# Patient Record
Sex: Male | Born: 1990 | Hispanic: No | Marital: Single | Smoking: Former smoker
Health system: Southern US, Community
[De-identification: ages and names within clinical notes are randomized; demographics above are authoritative.]

## PROBLEM LIST (undated history)

## (undated) DIAGNOSIS — K219 Gastro-esophageal reflux disease without esophagitis: Secondary | ICD-10-CM

## (undated) DIAGNOSIS — IMO0001 Reserved for inherently not codable concepts without codable children: Secondary | ICD-10-CM

---
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
## 2011-04-20 ENCOUNTER — Ambulatory Visit: Payer: Self-pay | Admitting: Family Medicine

## 2011-04-20 VITALS — BP 133/80 | HR 56 | Temp 97.6°F | Resp 16 | Ht 68.0 in | Wt 189.2 lb

## 2011-04-20 DIAGNOSIS — K219 Gastro-esophageal reflux disease without esophagitis: Secondary | ICD-10-CM

## 2011-04-20 DIAGNOSIS — R110 Nausea: Secondary | ICD-10-CM

## 2011-04-20 MED ORDER — PANTOPRAZOLE SODIUM 40 MG PO TBEC: DELAYED_RELEASE_TABLET | ORAL | Status: DC

## 2011-04-20 MED ORDER — METOCLOPRAMIDE HCL 5 MG PO TABS: ORAL_TABLET | ORAL | Status: DC

## 2011-04-20 NOTE — Progress Notes (Signed)
  Subjective:    Patient ID: Alexander Adams, male    DOB: 09/13/1990, 21 y.o.   MRN: [id number]  HPI Patient has been having nausea all week. He vomited Monday night. He has not been able to eat anything because of the nausea. He has taken OTC Prilosec without good relief. He has a long history of GERD but he has never had evaluation of it. He does not have any insurance. He has not been to a specialist about this.   Review of Systems     Objective:   Physical Exam [place] male, normal build. TMs normal throat. Throat clear. Neck supple without nodes. Chest clear to auscultation. Heart regular without with grade 1/6 systolic ejection murmur left upper sternal border. Abdomen soft without masses or tenderness except for maybe minimal epigastric tenderness. No CVA tenderness. Normal bowel sounds. Skin warm and dry.      Assessment & Plan:  GERD.  Nausea  Decided not to do additional labs yet. Will treat him for his symptoms, and see how he does. If he keeps having problems will need to come back at which time he would have to have labs including an H. pylori.

## 2011-04-20 NOTE — Patient Instructions (Addendum)
Take the medicine as ordered. Return sooner if problems.   Gastroesophageal Reflux Disease, Adult Gastroesophageal reflux disease (GERD) happens when acid from your stomach flows up into the esophagus. When acid comes in contact with the esophagus, the acid causes soreness (inflammation) in the esophagus. Over time, GERD may create small holes (ulcers) in the lining of the esophagus. CAUSES   Increased body weight. This puts pressure on the stomach, making acid rise from the stomach into the esophagus.   Smoking. This increases acid production in the stomach.   Drinking alcohol. This causes decreased pressure in the lower esophageal sphincter (valve or ring of muscle between the esophagus and stomach), allowing acid from the stomach into the esophagus.   Late evening meals and a full stomach. This increases pressure and acid production in the stomach.   A malformed lower esophageal sphincter.  Sometimes, no cause is found. SYMPTOMS   Burning pain in the lower part of the mid-chest behind the breastbone and in the mid-stomach area. This may occur twice a week or more often.   Trouble swallowing.   Sore throat.   Dry cough.   Asthma-like symptoms including chest tightness, shortness of breath, or wheezing.  DIAGNOSIS  Your caregiver may be able to diagnose GERD based on your symptoms. In some cases, X-rays and other tests may be done to check for complications or to check the condition of your stomach and esophagus. TREATMENT  Your caregiver may recommend over-the-counter or prescription medicines to help decrease acid production. Ask your caregiver before starting or adding any new medicines.  HOME CARE INSTRUCTIONS   Change the factors that you can control. Ask your caregiver for guidance concerning weight loss, quitting smoking, and alcohol consumption.   Avoid foods and drinks that make your symptoms worse, such as:   Caffeine or alcoholic drinks.   Chocolate.   Peppermint  or mint flavorings.   Garlic and onions.   Spicy foods.   Citrus fruits, such as oranges, lemons, or limes.   Tomato-based foods such as sauce, chili, salsa, and pizza.   Fried and fatty foods.   Avoid lying down for the 3 hours prior to your bedtime or prior to taking a nap.   Eat small, frequent meals instead of large meals.   Wear loose-fitting clothing. Do not wear anything tight around your waist that causes pressure on your stomach.   Raise the head of your bed 6 to 8 inches with wood blocks to help you sleep. Extra pillows will not help.   Only take over-the-counter or prescription medicines for pain, discomfort, or fever as directed by your caregiver.   Do not take aspirin, ibuprofen, or other nonsteroidal anti-inflammatory drugs (NSAIDs).  SEEK IMMEDIATE MEDICAL CARE IF:   You have pain in your arms, neck, jaw, teeth, or back.   Your pain increases or changes in intensity or duration.   You develop nausea, vomiting, or sweating (diaphoresis).   You develop shortness of breath, or you faint.   Your vomit is green, yellow, black, or looks like coffee grounds or blood.   Your stool is red, bloody, or black.  These symptoms could be signs of other problems, such as heart disease, gastric bleeding, or esophageal bleeding. MAKE SURE YOU:   Understand these instructions.   Will watch your condition.   Will get help right away if you are not doing well or get worse.  Document Released: 11/23/2004 Document Revised: 10/26/2010 Document Reviewed: 09/02/2010 [facility] Patient Information 2012 [employer] 

## 2011-11-11 ENCOUNTER — Emergency Department (HOSPITAL_BASED_OUTPATIENT_CLINIC_OR_DEPARTMENT_OTHER): Payer: Self-pay

## 2011-11-11 ENCOUNTER — Emergency Department (HOSPITAL_BASED_OUTPATIENT_CLINIC_OR_DEPARTMENT_OTHER)
Admission: EM | Admit: 2011-11-11 | Discharge: 2011-11-11 | Disposition: A | Discharge status: Home / Self Care | Payer: Self-pay | Attending: Emergency Medicine | Admitting: Emergency Medicine

## 2011-11-11 ENCOUNTER — Encounter (HOSPITAL_BASED_OUTPATIENT_CLINIC_OR_DEPARTMENT_OTHER): Payer: Self-pay | Admitting: *Deleted

## 2011-11-11 DIAGNOSIS — M549 Dorsalgia, unspecified: Secondary | ICD-10-CM | POA: Insufficient documentation

## 2011-11-11 DIAGNOSIS — Z87891 Personal history of nicotine dependence: Secondary | ICD-10-CM | POA: Insufficient documentation

## 2011-11-11 DIAGNOSIS — K219 Gastro-esophageal reflux disease without esophagitis: Secondary | ICD-10-CM | POA: Insufficient documentation

## 2011-11-11 HISTORY — DX: Reserved for inherently not codable concepts without codable children: IMO0001

## 2011-11-11 HISTORY — DX: Gastro-esophageal reflux disease without esophagitis: K21.9

## 2011-11-11 MED ORDER — IBUPROFEN 600 MG PO TABS: 600.0000 mg | ORAL_TABLET | Freq: Four times a day (QID) | ORAL | Status: AC | PRN

## 2011-11-11 NOTE — ED Provider Notes (Addendum)
History     CSN: [id number]  Arrival date & time 11/11/11  1240   First MD Initiated Contact with Patient 11/11/11 1321      Chief Complaint  Patient presents with  . Back Pain    (Consider location/radiation/quality/duration/timing/severity/associated sxs/prior treatment) Patient is a 21 y.o. male presenting with back pain. The history is provided by the patient.  Back Pain  The current episode started 2 days ago. The problem has not changed since onset.The pain is associated with falling (About 2 weeks ago.). The pain is present in the lumbar spine. The pain does not radiate. The pain is at a severity of 8/10. The symptoms are aggravated by certain positions. Pertinent negatives include no chest pain, no fever, no numbness, no headaches, no abdominal pain, no bowel incontinence, no perianal numbness, no dysuria, no leg pain, no paresthesias, no paresis, no tingling and no weakness.    Past Medical History  Diagnosis Date  . Reflux     History reviewed. No pertinent past surgical history.  History reviewed. No pertinent family history.  History  Substance Use Topics  . Smoking status: Former Smoker    Quit date: 08/17/2009  . Smokeless tobacco: Not on file  . Alcohol Use: No      Review of Systems  Constitutional: Negative for fever.  HENT: Negative for neck pain.   Eyes: Negative for visual disturbance.  Respiratory: Negative for shortness of breath.   Cardiovascular: Negative for chest pain.  Gastrointestinal: Negative for abdominal pain and bowel incontinence.  Genitourinary: Negative for dysuria.  Musculoskeletal: Positive for back pain.  Skin: Negative for rash.  Neurological: Negative for tingling, weakness, numbness, headaches and paresthesias.  Hematological: Does not bruise/bleed easily.    Allergies  Review of patient's allergies indicates no known allergies.  Home Medications   Current Outpatient Rx  Name Route Sig Dispense Refill  . RANITIDINE  HCL 150 MG PO TABS Oral Take 300 mg by mouth 2 (two) times daily.    Marland Kitchen METOCLOPRAMIDE HCL 5 MG PO TABS  Take 1 tablet 4 times daily as needed for nausea. 40 tablet 0  . OMEPRAZOLE 20 MG PO CPDR Oral Take 20 mg by mouth daily.    Marland Kitchen PANTOPRAZOLE SODIUM 40 MG PO TBEC  Take 1 twice daily for 5 days, then once daily for reflux. 30 tablet 3    BP 137/70  Pulse 50  Temp 98 F (36.7 C) (Oral)  Resp 18  Ht 5\' 10"  (1.778 m)  Wt 185 lb (83.915 kg)  BMI 26.54 kg/m2  SpO2 99%  Physical Exam  Nursing note and vitals reviewed. Constitutional: He is oriented to person, place, and time. He appears well-developed and well-nourished. No distress.  HENT:  Head: Normocephalic and atraumatic.  Mouth/Throat: Oropharynx is clear and moist.  Eyes: Conjunctivae normal and EOM are normal. Pupils are equal, round, and reactive to light.  Neck: Normal range of motion. Neck supple.  Cardiovascular: Normal rate, regular rhythm and normal heart sounds.   No murmur heard. Pulmonary/Chest: Effort normal and breath sounds normal.  Abdominal: Soft. Bowel sounds are normal. There is no tenderness.  Musculoskeletal: Normal range of motion. He exhibits tenderness.       Mild tenderness to the lumbar area predominantly on the right side no muscle spasm. No midline point tenderness.  Neurological: He is alert and oriented to person, place, and time. No cranial nerve deficit. He exhibits normal muscle tone. Coordination normal.  Skin: Skin is warm. No  rash noted.    ED Course  Procedures (including critical care time)  Labs Reviewed - No data to display Dg Lumbar Spine Complete  11/11/2011  *RADIOLOGY REPORT*  Clinical Data: Fall.  Back injury. Right-sided back pain  LUMBAR SPINE - COMPLETE 4+ VIEW  Comparison: None.  Findings: Linear transverse lucency in the right sacral ala probably represents incidental segmental transverse talar process or bowel gas, and is less likely to represent a transverse sacral fracture at  the S1-2 level particularly given the lack of corroborative findings on the lateral projection.  Lumbar spine alignment appears normal.  No lumbar spine compression fracture observed.  IMPRESSION:  1.  Linear lucency overlying the right sacral ala extends transversely and is probably incidental.  Correlate with any point tenderness over the right side of the sacrum; if such exists, consider CT or MRI to assess for sacral fracture.   Otherwise, no significant abnormality identified.   Original Report Authenticated By: Dellia Cloud, M.D.      1. Back pain       MDM  Patient with an injury to the back now on getting more information about 2 weeks ago when he had a fall but nothing bothered him then. Pack started bothering past few days does do a lot of working out. Most likely just musculoskeletal back pain lumbar series ordered to rule out any acute injury.   Lumbar plain films raise concern for a right-sided sacral fracture patient does have tenderness to palpation over that area we'll go ahead and do a CT of the pelvis to this out. Patient will be turned over to the evening emergency physician. For disposition.        Shelda Jakes, MD 11/11/11 1503  Shelda Jakes, MD 11/11/11 [phone]

## 2011-11-11 NOTE — ED Notes (Signed)
Pt states he works out a lot and thinks that he may have injured his lower back "over time".

## 2012-08-30 ENCOUNTER — Emergency Department (HOSPITAL_COMMUNITY)
Admission: EM | Admit: 2012-08-30 | Discharge: 2012-08-30 | Disposition: A | Discharge status: Home / Self Care | Payer: No Typology Code available for payment source | Attending: Emergency Medicine | Admitting: Emergency Medicine

## 2012-08-30 ENCOUNTER — Encounter (HOSPITAL_COMMUNITY): Payer: Self-pay

## 2012-08-30 DIAGNOSIS — Z87891 Personal history of nicotine dependence: Secondary | ICD-10-CM | POA: Insufficient documentation

## 2012-08-30 DIAGNOSIS — IMO0002 Reserved for concepts with insufficient information to code with codable children: Secondary | ICD-10-CM | POA: Insufficient documentation

## 2012-08-30 DIAGNOSIS — Z79899 Other long term (current) drug therapy: Secondary | ICD-10-CM | POA: Insufficient documentation

## 2012-08-30 DIAGNOSIS — K219 Gastro-esophageal reflux disease without esophagitis: Secondary | ICD-10-CM | POA: Insufficient documentation

## 2012-08-30 DIAGNOSIS — Y9389 Activity, other specified: Secondary | ICD-10-CM | POA: Insufficient documentation

## 2012-08-30 DIAGNOSIS — Y92410 Unspecified street and highway as the place of occurrence of the external cause: Secondary | ICD-10-CM | POA: Insufficient documentation

## 2012-08-30 DIAGNOSIS — T148XXA Other injury of unspecified body region, initial encounter: Secondary | ICD-10-CM

## 2012-08-30 MED ORDER — CYCLOBENZAPRINE HCL 10 MG PO TABS: 10.0000 mg | ORAL_TABLET | Freq: Two times a day (BID) | ORAL | Status: DC | PRN

## 2012-08-30 MED ORDER — NAPROXEN 500 MG PO TABS: 500.0000 mg | ORAL_TABLET | Freq: Two times a day (BID) | ORAL | Status: DC
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
## 2012-09-01 NOTE — ED Provider Notes (Signed)
Medical screening examination/treatment/procedure(s) were performed by non-physician practitioner and as supervising physician I was immediately available for consultation/collaboration.  Olivia Mackie, MD 09/01/12 2112

## 2013-01-12 ENCOUNTER — Emergency Department (HOSPITAL_BASED_OUTPATIENT_CLINIC_OR_DEPARTMENT_OTHER)
Admission: EM | Admit: 2013-01-12 | Discharge: 2013-01-12 | Disposition: A | Discharge status: Home / Self Care | Payer: Self-pay | Attending: Emergency Medicine | Admitting: Emergency Medicine

## 2013-01-12 ENCOUNTER — Encounter (HOSPITAL_BASED_OUTPATIENT_CLINIC_OR_DEPARTMENT_OTHER): Payer: Self-pay | Admitting: Emergency Medicine

## 2013-01-12 ENCOUNTER — Emergency Department (HOSPITAL_BASED_OUTPATIENT_CLINIC_OR_DEPARTMENT_OTHER): Payer: Self-pay

## 2013-01-12 DIAGNOSIS — W2209XA Striking against other stationary object, initial encounter: Secondary | ICD-10-CM | POA: Insufficient documentation

## 2013-01-12 DIAGNOSIS — Z87891 Personal history of nicotine dependence: Secondary | ICD-10-CM | POA: Insufficient documentation

## 2013-01-12 DIAGNOSIS — Y9389 Activity, other specified: Secondary | ICD-10-CM | POA: Insufficient documentation

## 2013-01-12 DIAGNOSIS — S8990XA Unspecified injury of unspecified lower leg, initial encounter: Secondary | ICD-10-CM | POA: Insufficient documentation

## 2013-01-12 DIAGNOSIS — K219 Gastro-esophageal reflux disease without esophagitis: Secondary | ICD-10-CM | POA: Insufficient documentation

## 2013-01-12 DIAGNOSIS — Z79899 Other long term (current) drug therapy: Secondary | ICD-10-CM | POA: Insufficient documentation

## 2013-01-12 DIAGNOSIS — Y9239 Other specified sports and athletic area as the place of occurrence of the external cause: Secondary | ICD-10-CM | POA: Insufficient documentation

## 2013-01-12 DIAGNOSIS — M79673 Pain in unspecified foot: Secondary | ICD-10-CM | POA: Diagnosis present

## 2013-01-12 DIAGNOSIS — M79671 Pain in right foot: Secondary | ICD-10-CM

## 2013-01-12 HISTORY — DX: Gastro-esophageal reflux disease without esophagitis: K21.9

## 2013-01-12 MED ORDER — OXYCODONE-ACETAMINOPHEN 5-325 MG PO TABS: 1.0000 | ORAL_TABLET | ORAL | Status: DC | PRN

## 2013-01-12 NOTE — ED Provider Notes (Signed)
CSN: [id number]     Arrival date & time 01/12/13  1054 History   First MD Initiated Contact with Patient 01/12/13 1117     Chief Complaint  Patient presents with  . Foot Pain   (Consider location/radiation/quality/duration/timing/severity/associated sxs/prior Treatment) Patient is a 22 y.o. male presenting with lower extremity pain. The history is provided by the patient.  Foot Pain This is a new problem. The current episode started more than 2 days ago. The problem occurs constantly. The problem has not changed since onset.Pertinent negatives include no chest pain, no abdominal pain, no headaches and no shortness of breath. Exacerbated by: walking for long periods of time. Nothing relieves the symptoms. He has tried nothing for the symptoms. The treatment provided moderate relief.    Past Medical History  Diagnosis Date  . Reflux   . GERD (gastroesophageal reflux disease)    History reviewed. No pertinent past surgical history. No family history on file. History  Substance Use Topics  . Smoking status: Former Smoker    Quit date: 08/17/2009  . Smokeless tobacco: Not on file  . Alcohol Use: No    Review of Systems  Constitutional: Negative for fever.  HENT: Negative for drooling and rhinorrhea.   Eyes: Negative for pain.  Respiratory: Negative for cough and shortness of breath.   Cardiovascular: Negative for chest pain and leg swelling.  Gastrointestinal: Negative for nausea, vomiting, abdominal pain and diarrhea.  Genitourinary: Negative for dysuria and hematuria.  Musculoskeletal: Negative for gait problem and neck pain.  Skin: Negative for color change.  Neurological: Negative for numbness and headaches.  Hematological: Negative for adenopathy.  Psychiatric/Behavioral: Negative for behavioral problems.  All other systems reviewed and are negative.    Allergies  Review of patient's allergies indicates no known allergies.  Home Medications   Current Outpatient Rx   Name  Route  Sig  Dispense  Refill  . esomeprazole (NEXIUM) 20 MG capsule   Oral   Take 20 mg by mouth daily at 12 noon.          BP 150/84  Pulse 58  Temp(Src) 97.9 F (36.6 C) (Oral)  Wt 195 lb (88.451 kg)  SpO2 100% Physical Exam  Nursing note and vitals reviewed. Constitutional: He is oriented to person, place, and time. He appears well-developed and well-nourished.  HENT:  Head: Normocephalic and atraumatic.  Right Ear: External ear normal.  Left Ear: External ear normal.  Nose: Nose normal.  Mouth/Throat: Oropharynx is clear and moist. No oropharyngeal exudate.  Eyes: Conjunctivae and EOM are normal. Pupils are equal, round, and reactive to light.  Neck: Normal range of motion. Neck supple.  Cardiovascular: Normal rate, regular rhythm, normal heart sounds and intact distal pulses.  Exam reveals no gallop and no friction rub.   No murmur heard. Pulmonary/Chest: Effort normal and breath sounds normal. No respiratory distress. He has no wheezes.  Abdominal: Soft. Bowel sounds are normal. He exhibits no distension. There is no tenderness. There is no rebound and no guarding.  Musculoskeletal: Normal range of motion. He exhibits tenderness. He exhibits no edema.  Normal range of motion of right lower extremity. 2+ distal pulses. Sensation is intact diffusely.  The patient has a small bony prominence at the base of the fifth metatarsal on the right foot. It is only mildly tender to palpation.  Neurological: He is alert and oriented to person, place, and time.  Skin: Skin is warm and dry.  Psychiatric: He has a normal mood and affect.  His behavior is normal.    ED Course  Procedures (including critical care time) Labs Review Labs Reviewed - No data to display Imaging Review Dg Foot Complete Right  01/12/2013   CLINICAL DATA:  Lateral pain after trauma.  EXAM: RIGHT FOOT COMPLETE - 3+ VIEW  COMPARISON:  None.  FINDINGS: No acute fracture or dislocation.  IMPRESSION: No  acute osseous abnormality.   Electronically Signed   By: Jeronimo Greaves M.D.   On: 01/12/2013 11:15    EKG Interpretation   None       MDM   1. Foot pain, right    11:30 AM 22 y.o. male who presents with right lateral foot pain after kicking a bag in the gym while working out several days ago. The patient is ambulatory without difficulty. He notes only mild tenderness to palpation on exam. He has an obvious mild bony deformity on the right lateral foot on my exam. His plain film is completely normal and I discussed the films with the radiologist who sees no abnormality. It is possible that this is an anatomic variant that has always been there and is just now being noticed. Another possibility is that this is a small hematoma or soft tissue swelling that just feels hard to palpation. Will recommend hard soled shoes and follow up with ortho if his pain persists.  11:31 AM:  I have discussed the diagnosis/risks/treatment options with the patient and believe the pt to be eligible for discharge home to follow-up with ortho if his pain persists. We also discussed returning to the ED immediately if new or worsening sx occur. We discussed the sx which are most concerning (e.g., worsening pain) that necessitate immediate return. Any new prescriptions provided to the patient are listed below.  Discharge Medication List as of 01/12/2013 11:37 AM    START taking these medications   Details  oxyCODONE-acetaminophen (PERCOCET) 5-325 MG per tablet Take 1 tablet by mouth every 4 (four) hours as needed., Starting 01/12/2013, Until Discontinued, Print           Junius Argyle, MD 01/12/13 [phone]

## 2013-01-12 NOTE — ED Notes (Signed)
Patient here with right lateral foot pain after kicking bench at the gym, tender to touch with swelling noted

## 2013-01-29 ENCOUNTER — Ambulatory Visit: Payer: Self-pay

## 2014-02-16 ENCOUNTER — Emergency Department (HOSPITAL_BASED_OUTPATIENT_CLINIC_OR_DEPARTMENT_OTHER)
Admission: EM | Admit: 2014-02-16 | Discharge: 2014-02-16 | Disposition: A | Discharge status: Home / Self Care | Payer: No Typology Code available for payment source | Attending: Emergency Medicine | Admitting: Emergency Medicine

## 2014-02-16 ENCOUNTER — Encounter (HOSPITAL_BASED_OUTPATIENT_CLINIC_OR_DEPARTMENT_OTHER): Payer: Self-pay | Admitting: *Deleted

## 2014-02-16 DIAGNOSIS — K219 Gastro-esophageal reflux disease without esophagitis: Secondary | ICD-10-CM | POA: Insufficient documentation

## 2014-02-16 DIAGNOSIS — Z79899 Other long term (current) drug therapy: Secondary | ICD-10-CM | POA: Insufficient documentation

## 2014-02-16 DIAGNOSIS — Z87891 Personal history of nicotine dependence: Secondary | ICD-10-CM | POA: Insufficient documentation

## 2014-02-16 DIAGNOSIS — H6123 Impacted cerumen, bilateral: Secondary | ICD-10-CM | POA: Insufficient documentation

## 2014-02-16 DIAGNOSIS — H6091 Unspecified otitis externa, right ear: Secondary | ICD-10-CM | POA: Insufficient documentation

## 2014-02-16 MED ORDER — ANTIPYRINE-BENZOCAINE 5.4-1.4 % OT SOLN: 3.0000 [drp] | Freq: Three times a day (TID) | OTIC | Status: DC | PRN

## 2014-02-16 MED ORDER — NEOMYCIN-POLYMYXIN-HC 3.5-10000-1 OT SUSP: 4.0000 [drp] | Freq: Four times a day (QID) | OTIC | Status: DC

## 2014-02-16 NOTE — ED Provider Notes (Signed)
CSN: [id number]     Arrival date & time 02/16/14  1156 History   First MD Initiated Contact with Patient 02/16/14 1211     Chief Complaint  Patient presents with  . Otalgia     (Consider location/radiation/quality/duration/timing/severity/associated sxs/prior Treatment) HPI Comments: 23 year old male presenting with right ear pain 1 day. Patient reports last night he used an over-the-counter earwax remover drop, and believes he got the wax stuck. He developed pain after putting the drops in. States he's had decreased hearing in the right ear. Denies fever, chills, ear drainage.  Patient is a 23 y.o. male presenting with ear pain. The history is provided by the patient.  Otalgia Associated symptoms: hearing loss   Associated symptoms: no fever and no vomiting     Past Medical History  Diagnosis Date  . Reflux   . GERD (gastroesophageal reflux disease)    History reviewed. No pertinent past surgical history. No family history on file. History  Substance Use Topics  . Smoking status: Former Smoker    Quit date: 08/17/2009  . Smokeless tobacco: Not on file  . Alcohol Use: No    Review of Systems  Constitutional: Negative for fever.  HENT: Positive for ear pain and hearing loss.   Respiratory: Negative.   Cardiovascular: Negative.   Gastrointestinal: Negative for vomiting.  Musculoskeletal: Negative.   Skin: Negative.   Neurological: Negative.       Allergies  Review of patient's allergies indicates no known allergies.  Home Medications   Prior to Admission medications   Medication Sig Start Date End Date Taking? Authorizing Provider  Omeprazole (PRILOSEC PO) Take by mouth.   Yes Historical Provider, MD  antipyrine-benzocaine Lyla Son(AURALGAN) otic solution Place 3-4 drops into the right ear every 8 (eight) hours as needed for ear pain. 02/16/14   Ridwan Bondy M Perkins Molina, PA-C  esomeprazole (NEXIUM) 20 MG capsule Take 20 mg by mouth daily at 12 noon.    Historical Provider, MD   neomycin-polymyxin-hydrocortisone (CORTISPORIN) 3.5-10000-1 otic suspension Place 4 drops into the right ear 4 (four) times daily. X 7 days 02/16/14   Kathrynn Speedobyn M Tyvon Eggenberger, PA-C  oxyCODONE-acetaminophen (PERCOCET) 5-325 MG per tablet Take 1 tablet by mouth every 4 (four) hours as needed. 01/12/13   Purvis SheffieldForrest Harrison, MD   BP 157/74 mmHg  Pulse 64  Temp(Src) 98.3 F (36.8 C) (Oral)  Resp 16  Ht 5\' 10"  (1.778 m)  Wt 190 lb (86.183 kg)  BMI 27.26 kg/m2  SpO2 100% Physical Exam  Constitutional: He is oriented to person, place, and time. He appears well-developed and well-nourished. No distress.  HENT:  Head: Normocephalic and atraumatic.  BL TM obstructed by cerumen.  Eyes: Conjunctivae and EOM are normal.  Neck: Normal range of motion. Neck supple.  Cardiovascular: Normal rate, regular rhythm and normal heart sounds.   Pulmonary/Chest: Effort normal and breath sounds normal.  Musculoskeletal: Normal range of motion. He exhibits no edema.  Neurological: He is alert and oriented to person, place, and time.  Skin: Skin is warm and dry.  Psychiatric: He has a normal mood and affect. His behavior is normal.  Nursing note and vitals reviewed.   ED Course  Irrigation Date/Time: 02/16/2014 12:55 PM Performed by: Kathrynn SpeedHESS, Breylon Sherrow M Authorized by: Kathrynn SpeedHESS, Clark Clowdus M Consent: Verbal consent obtained. Consent given by: patient Comments: Cerumen removal with irrigation and curette   (including critical care time) Labs Review Labs Reviewed - No data to display  Imaging Review No results found.   EKG Interpretation None  MDM   Final diagnoses:  Right otitis externa  Cerumen impaction, bilateral   Patient in no apparent distress. Vital signs stable. Bilateral cerumen impaction. Irrigation done to both ears, small amount of wax removed. He still has wax present in both ears after irrigation, further wax removal with curette on right. He is refusing flax removal with curette on left. Underlying  otitis externa on right. Treat with Cortisporin ear drops. Stable for discharge. Return precautions given. Patient states understanding of treatment care plan and is agreeable.  Kathrynn SpeedRobyn M Bonnee Zertuche, PA-C 02/16/14 1257  Geoffery Lyonsouglas Delo, MD 02/16/14 [phone]

## 2014-02-16 NOTE — Discharge Instructions (Signed)
Use ear drops as prescribed.  Cerumen Impaction A cerumen impaction is when the wax in your ear forms a plug. This plug usually causes reduced hearing. Sometimes it also causes an earache or dizziness. Removing a cerumen impaction can be difficult and painful. The wax sticks to the ear canal. The canal is sensitive and bleeds easily. If you try to remove a heavy wax buildup with a cotton tipped swab, you may push it in further. Irrigation with water, suction, and small ear curettes may be used to clear out the wax. If the impaction is fixed to the skin in the ear canal, ear drops may be needed for a few days to loosen the wax. People who build up a lot of wax frequently can use ear wax removal products available in your local drugstore. SEEK MEDICAL CARE IF:  You develop an earache, increased hearing loss, or marked dizziness. Document Released: 03/23/2004 Document Revised: 05/08/2011 Document Reviewed: 05/13/2009 [facility] Patient Information 2015 [place], [place]. This information is not intended to replace advice given to you by your health care provider. Make sure you discuss any questions you have with your health care provider.  Otitis Externa Otitis externa is a bacterial or fungal infection of the outer ear canal. This is the area from the eardrum to the outside of the ear. Otitis externa is sometimes called "swimmer's ear." CAUSES  Possible causes of infection include:  Swimming in dirty water.  Moisture remaining in the ear after swimming or bathing.  Mild injury (trauma) to the ear.  Objects stuck in the ear (foreign body).  Cuts or scrapes (abrasions) on the outside of the ear. SIGNS AND SYMPTOMS  The first symptom of infection is often itching in the ear canal. Later signs and symptoms may include swelling and redness of the ear canal, ear pain, and yellowish-white fluid (pus) coming from the ear. The ear pain may be worse when pulling on the earlobe. DIAGNOSIS  Your health care  provider will perform a physical exam. A sample of fluid may be taken from the ear and examined for bacteria or fungi. TREATMENT  Antibiotic ear drops are often given for 10 to 14 days. Treatment may also include pain medicine or corticosteroids to reduce itching and swelling. HOME CARE INSTRUCTIONS   Apply antibiotic ear drops to the ear canal as prescribed by your health care provider.  Take medicines only as directed by your health care provider.  If you have diabetes, follow any additional treatment instructions from your health care provider.  Keep all follow-up visits as directed by your health care provider. PREVENTION   Keep your ear dry. Use the corner of a towel to absorb water out of the ear canal after swimming or bathing.  Avoid scratching or putting objects inside your ear. This can damage the ear canal or remove the protective wax that lines the canal. This makes it easier for bacteria and fungi to grow.  Avoid swimming in lakes, polluted water, or poorly chlorinated pools.  You may use ear drops made of rubbing alcohol and vinegar after swimming. Combine equal parts of white vinegar and alcohol in a bottle. Put 3 or 4 drops into each ear after swimming. SEEK MEDICAL CARE IF:   You have a fever.  Your ear is still red, swollen, painful, or draining pus after 3 days.  Your redness, swelling, or pain gets worse.  You have a severe headache.  You have redness, swelling, pain, or tenderness in the area behind your  ear. MAKE SURE YOU:   Understand these instructions.  Will watch your condition.  Will get help right away if you are not doing well or get worse. Document Released: 02/13/2005 Document Revised: 06/30/2013 Document Reviewed: 03/02/2011 [facility] Patient Information 2015 [place], [place]. This information is not intended to replace advice given to you by your health care provider. Make sure you discuss any questions you have with your health care  provider.

## 2014-02-16 NOTE — ED Notes (Signed)
Pain in his right ear. He used ear wax remover and suction last night and it got worse.

## 2014-07-11 ENCOUNTER — Encounter (HOSPITAL_BASED_OUTPATIENT_CLINIC_OR_DEPARTMENT_OTHER): Payer: Self-pay

## 2014-07-11 ENCOUNTER — Emergency Department (HOSPITAL_BASED_OUTPATIENT_CLINIC_OR_DEPARTMENT_OTHER)
Admission: EM | Admit: 2014-07-11 | Discharge: 2014-07-11 | Disposition: A | Discharge status: Home / Self Care | Payer: Self-pay | Attending: Emergency Medicine | Admitting: Emergency Medicine

## 2014-07-11 DIAGNOSIS — Z87891 Personal history of nicotine dependence: Secondary | ICD-10-CM | POA: Insufficient documentation

## 2014-07-11 DIAGNOSIS — J029 Acute pharyngitis, unspecified: Secondary | ICD-10-CM | POA: Insufficient documentation

## 2014-07-11 DIAGNOSIS — R197 Diarrhea, unspecified: Secondary | ICD-10-CM | POA: Insufficient documentation

## 2014-07-11 DIAGNOSIS — Z79899 Other long term (current) drug therapy: Secondary | ICD-10-CM | POA: Insufficient documentation

## 2014-07-11 DIAGNOSIS — K219 Gastro-esophageal reflux disease without esophagitis: Secondary | ICD-10-CM | POA: Insufficient documentation

## 2014-07-11 LAB — RAPID STREP SCREEN (MED CTR MEBANE ONLY): Streptococcus, Group A Screen (Direct): NEGATIVE

## 2014-07-11 NOTE — ED Notes (Signed)
Patient here with 1 week of sore throat

## 2014-07-11 NOTE — Discharge Instructions (Signed)

## 2014-07-11 NOTE — ED Notes (Signed)
Pt states has had a sore throat for past 4 -5 days, pain upon swallowing. No fever, states has had slight diarrhea. Appetite good per pt statement

## 2014-07-12 NOTE — ED Provider Notes (Signed)
CSN: [id number]     Arrival date & time 07/11/14  1154 History   First MD Initiated Contact with Patient 07/11/14 1404     Chief Complaint  Patient presents with  . Sore Throat     (Consider location/radiation/quality/duration/timing/severity/associated sxs/prior Treatment) Patient is a 24 y.o. male presenting with pharyngitis. The history is provided by the patient. No language interpreter was used.  Sore Throat This is a new problem. The current episode started more than 2 days ago (4-5 days). The problem has not changed since onset.Pertinent negatives include no chest pain, no abdominal pain, no headaches and no shortness of breath. Associated symptoms comments: Diarrhea . Nothing aggravates the symptoms. Nothing relieves the symptoms. Treatments tried: some of mother's left over abx. The treatment provided no relief.    Past Medical History  Diagnosis Date  . Reflux   . GERD (gastroesophageal reflux disease)    History reviewed. No pertinent past surgical history. No family history on file. History  Substance Use Topics  . Smoking status: Former Smoker    Quit date: 08/17/2009  . Smokeless tobacco: Not on file  . Alcohol Use: No    Review of Systems  Constitutional: Negative for fever, activity change, appetite change and fatigue.  HENT: Negative for congestion, facial swelling, rhinorrhea and trouble swallowing.   Eyes: Negative for photophobia and pain.  Respiratory: Negative for cough, chest tightness and shortness of breath.   Cardiovascular: Negative for chest pain and leg swelling.  Gastrointestinal: Negative for nausea, vomiting, abdominal pain, diarrhea and constipation.  Endocrine: Negative for polydipsia and polyuria.  Genitourinary: Negative for dysuria, urgency, decreased urine volume and difficulty urinating.  Musculoskeletal: Negative for back pain and gait problem.  Skin: Negative for color change, rash and wound.  Allergic/Immunologic: Negative for  immunocompromised state.  Neurological: Negative for dizziness, facial asymmetry, speech difficulty, weakness, numbness and headaches.  Psychiatric/Behavioral: Negative for confusion, decreased concentration and agitation.      Allergies  Review of patient's allergies indicates no known allergies.  Home Medications   Prior to Admission medications   Medication Sig Start Date End Date Taking? Authorizing Provider  esomeprazole (NEXIUM) 20 MG capsule Take 20 mg by mouth daily at 12 noon.    Historical Provider, MD  Omeprazole (PRILOSEC PO) Take by mouth.    Historical Provider, MD   BP 152/80 mmHg  Pulse 70  Temp(Src) 98.5 F (36.9 C) (Oral)  Resp 18  Ht 5\' 8"  (1.727 m)  Wt 195 lb (88.451 kg)  BMI 29.66 kg/m2  SpO2 100% Physical Exam  Constitutional: He is oriented to person, place, and time. He appears well-developed and well-nourished. No distress.  HENT:  Head: Normocephalic and atraumatic.  Mouth/Throat: Posterior oropharyngeal erythema present. No oropharyngeal exudate, posterior oropharyngeal edema or tonsillar abscesses.  Eyes: Pupils are equal, round, and reactive to light.  Neck: Normal range of motion. Neck supple.  Cardiovascular: Normal rate, regular rhythm and normal heart sounds.  Exam reveals no gallop and no friction rub.   No murmur heard. Pulmonary/Chest: Effort normal and breath sounds normal. No respiratory distress. He has no wheezes. He has no rales.  Abdominal: Soft. Bowel sounds are normal. He exhibits no distension and no mass. There is no tenderness. There is no rebound and no guarding.  Musculoskeletal: Normal range of motion. He exhibits no edema or tenderness.  Neurological: He is alert and oriented to person, place, and time.  Skin: Skin is warm and dry.  Psychiatric: He has a normal  mood and affect.    ED Course  Procedures (including critical care time) Labs Review Labs Reviewed  RAPID STREP SCREEN  CULTURE, GROUP A STREP    Imaging  Review No results found.   EKG Interpretation None      MDM   Final diagnoses:  Pharyngitis    Pt is a 24 y.o. male with Pmhx as above who presents with for 5 days of sore throat.  Patient has had no fever, no cough, no difficulty swallowing.  He took several of his mother's left over antibiotics and is also developed diarrhea, which is likely due to the antibiotics.  On physical exam, vital signs stable.  Patient is in no acute distress.  He has no sign of retropharyngeal or peritonsillar abscess.  No neck pain or stiffness.  Rapid strep ordered from triage is negative.  Will recommend continued supportive care     Len ChildsWaleed H Ellwanger evaluation in the Emergency Department is complete. It has been determined that no acute conditions requiring further emergency intervention are present at this time. The patient/guardian have been advised of the diagnosis and plan. We have discussed signs and symptoms that warrant return to the ED, such as changes or worsening in symptoms, worsening pain, fever, difficulty swallowing      Toy CookeyMegan Merlene Dante, MD 07/12/14 [phone]

## 2014-07-14 LAB — CULTURE, GROUP A STREP

## 2015-10-03 ENCOUNTER — Emergency Department (HOSPITAL_BASED_OUTPATIENT_CLINIC_OR_DEPARTMENT_OTHER)
Admission: EM | Admit: 2015-10-03 | Discharge: 2015-10-03 | Disposition: A | Discharge status: Home / Self Care | Payer: Self-pay | Attending: Emergency Medicine | Admitting: Emergency Medicine

## 2015-10-03 ENCOUNTER — Encounter (HOSPITAL_BASED_OUTPATIENT_CLINIC_OR_DEPARTMENT_OTHER): Payer: Self-pay | Admitting: *Deleted

## 2015-10-03 DIAGNOSIS — Z87891 Personal history of nicotine dependence: Secondary | ICD-10-CM | POA: Insufficient documentation

## 2015-10-03 DIAGNOSIS — R21 Rash and other nonspecific skin eruption: Secondary | ICD-10-CM | POA: Insufficient documentation

## 2015-10-03 DIAGNOSIS — Z79899 Other long term (current) drug therapy: Secondary | ICD-10-CM | POA: Insufficient documentation

## 2015-10-03 MED ORDER — HYDROCORTISONE 2.5 % EX LOTN: TOPICAL_LOTION | Freq: Two times a day (BID) | CUTANEOUS | 0 refills | Status: DC

## 2015-10-03 MED ORDER — PERMETHRIN 5 % EX CREA: 1.0000 "application " | TOPICAL_CREAM | Freq: Once | CUTANEOUS | 0 refills | Status: AC

## 2015-10-03 MED ORDER — PREDNISONE 20 MG PO TABS: 60.0000 mg | ORAL_TABLET | Freq: Every day | ORAL | 0 refills | Status: DC

## 2015-10-03 NOTE — ED Triage Notes (Signed)
Patient states he developed a red raised itchy rash on his lower abdomen, and bilateral forearms two days ago.  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 Describes rash as itchy.

## 2015-10-03 NOTE — ED Provider Notes (Signed)
[facility] Provider Note   CSN: [id number] Arrival date & time: 10/03/15  1405  First Provider Contact:  First MD Initiated Contact with Patient 10/03/15 1605        By signing my name below, I, Doreatha Martin, attest that this documentation has been prepared under the direction and in the presence of  Zaeem Kandel, PA-C. Electronically Signed: Doreatha Martin, ED Scribe. 10/03/15. 4:13 PM.   History   Chief Complaint Chief Complaint  Patient presents with  . Rash    HPI Alexander Adams is a 25 y.o. male who presents to the Emergency Department complaining of a moderately pruritic rash to the lower abdomen, left arm and right wrist x 1 day. He reports his only known new exposure was when he was working on a car 2 days ago, during which time he was lying on the ground and his abdomen and arms were exposed to vegatation. No new soaps, lotions, detergents, medications. No known sick contacts with similar symptoms. Pt reports he has tried Terbinafine Hydrochloride 1% cream PTA with no relief of symptoms. He denies fever, chills, sore throat, additional symptoms.   The history is provided by the patient. No language interpreter was used.    Past Medical History:  Diagnosis Date  . GERD (gastroesophageal reflux disease)   . Reflux     Patient Active Problem List   Diagnosis Date Noted  . Foot pain 01/12/2013    History reviewed. No pertinent surgical history.    Home Medications    Prior to Admission medications   Medication Sig Start Date End Date Taking? Authorizing Provider  Omeprazole (PRILOSEC PO) Take by mouth.   Yes Historical Provider, MD  esomeprazole (NEXIUM) 20 MG capsule Take 20 mg by mouth daily at 12 noon.    Historical Provider, MD    Family History No family history on file.  Social History Social History  Substance Use Topics  . Smoking status: Former Smoker    Quit date: 08/17/2009  . Smokeless tobacco: Never Used  . Alcohol use No      Allergies   Review of patient's allergies indicates no known allergies.   Review of Systems Review of Systems  Constitutional: Negative for chills and fever.  HENT: Negative for sore throat.   Skin: Positive for rash.     Physical Exam Updated Vital Signs BP 116/75 (BP Location: Right Arm)   Pulse 60   Temp 98.3 F (36.8 C) (Oral)   Resp 20   Ht  (1.727 m)   Wt 190 lb (86.2 kg)   BMI 28.89 kg/m   Physical Exam  Constitutional: He appears well-developed and well-nourished.  HENT:  Head: Normocephalic.  Mouth/Throat: Oropharynx is clear and moist.  Eyes: Conjunctivae are normal.  Cardiovascular: Normal rate, regular rhythm and normal heart sounds.   No murmur heard. Pulmonary/Chest: Effort normal and breath sounds normal. No respiratory distress. He has no wheezes. He has no rales.  Lungs CTA bilaterally.   Musculoskeletal: Normal range of motion.  Neurological: He is alert.  Skin: Skin is warm and dry. Rash noted.  Erythematous papular rash located across his lower abdomen and the volar aspect of the right wrist. No rash on the web spaces of the fingers. The rash is also present on the left forearm and the left upper arm.   Psychiatric: He has a normal mood and affect. His behavior is normal.  Nursing note and vitals reviewed.    ED Treatments /  Results  Labs (all labs ordered are listed, but only abnormal results are displayed) Labs Reviewed - No data to display  EKG  EKG Interpretation None       Radiology No results found.  Procedures Procedures (including critical care time)  DIAGNOSTIC STUDIES: Oxygen Saturation is 100% on RA, normal by my interpretation.    COORDINATION OF CARE: 4:12 PM Discussed treatment plan with pt at bedside which includes topical medication and pt agreed to plan.    Medications Ordered in ED Medications - No data to display   Initial Impression / Assessment and Plan / ED Course  I have reviewed the  triage vital signs and the nursing notes.  Pertinent labs & imaging results that were available during my care of the patient were reviewed by me and considered in my medical decision making (see chart for details).  Clinical Course    Alexander Adams presents to the ED for evaluation of rash. Appearance of the rash is most consistent with a poison oak/ivy.  However, the distribution has characteristics of Scabies.  Patient will be sent home with Prednisone, 2.5% Hydrocortisone, 5% Permethrin.  Patient instructed to start with the Prednisone and Hydrocortisone and if no improvement after a week to then start the Permethrin. . Patient appears stable for discharge at this time. Return precautions discussed and outlined in discharge paperwork. Patient is agreeable to plan.     Final Clinical Impressions(s) / ED Diagnoses   Final diagnoses:  None    New Prescriptions New Prescriptions   HYDROCORTISONE 2.5 % LOTION    Apply topically 2 (two) times daily.   PERMETHRIN (ACTICIN) 5 % CREAM    Apply 1 application topically once.   PREDNISONE (DELTASONE) 20 MG TABLET    Take 3 tablets (60 mg total) by mouth daily.   I personally performed the services described in this documentation, which was scribed in my presence. The recorded information has been reviewed and is accurate.    Santiago GladHeather Shaelyn Decarli, PA-C 10/06/15 [id number]    Lavera Guiseana Duo Liu, MD 10/06/15 [phone]

## 2015-10-03 NOTE — ED Notes (Signed)
PA with pt.

## 2015-11-24 ENCOUNTER — Emergency Department (HOSPITAL_BASED_OUTPATIENT_CLINIC_OR_DEPARTMENT_OTHER)
Admission: EM | Admit: 2015-11-24 | Discharge: 2015-11-24 | Disposition: A | Discharge status: Home / Self Care | Payer: No Typology Code available for payment source | Attending: Emergency Medicine | Admitting: Emergency Medicine

## 2015-11-24 ENCOUNTER — Encounter (HOSPITAL_BASED_OUTPATIENT_CLINIC_OR_DEPARTMENT_OTHER): Payer: Self-pay

## 2015-11-24 ENCOUNTER — Emergency Department (HOSPITAL_BASED_OUTPATIENT_CLINIC_OR_DEPARTMENT_OTHER): Payer: No Typology Code available for payment source

## 2015-11-24 DIAGNOSIS — S4992XA Unspecified injury of left shoulder and upper arm, initial encounter: Secondary | ICD-10-CM | POA: Diagnosis present

## 2015-11-24 DIAGNOSIS — Y9389 Activity, other specified: Secondary | ICD-10-CM | POA: Insufficient documentation

## 2015-11-24 DIAGNOSIS — Y92410 Unspecified street and highway as the place of occurrence of the external cause: Secondary | ICD-10-CM | POA: Insufficient documentation

## 2015-11-24 DIAGNOSIS — Y999 Unspecified external cause status: Secondary | ICD-10-CM | POA: Insufficient documentation

## 2015-11-24 DIAGNOSIS — Z87891 Personal history of nicotine dependence: Secondary | ICD-10-CM | POA: Insufficient documentation

## 2015-11-24 DIAGNOSIS — M25512 Pain in left shoulder: Secondary | ICD-10-CM | POA: Diagnosis not present

## 2015-11-24 MED ORDER — IBUPROFEN 600 MG PO TABS: 600.0000 mg | ORAL_TABLET | Freq: Four times a day (QID) | ORAL | 0 refills | Status: DC | PRN
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
## 2015-11-24 NOTE — ED Notes (Signed)
Patient transported to X-ray 

## 2015-11-24 NOTE — ED Notes (Signed)
Upon entering pt room. Pt has left. Unable to give d/c instruction.
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
## 2015-11-24 NOTE — Discharge Instructions (Signed)
Take your medications as prescribed as needed for pain relief. I recommend applied ice to shoulder for 15 tablets 3-4 times daily as needed for pain relief. I recommend resting screening from doing any heavy lifting or repetitive movements that exacerbate her symptoms for the next few days. Follow-up with the orthopedic office above in the next week if your symptoms have not improved. Please return to the Emergency Department if symptoms worsen or new onset of fever, swelling, numbness, tingling, weakness.

## 2016-10-16 ENCOUNTER — Emergency Department (HOSPITAL_BASED_OUTPATIENT_CLINIC_OR_DEPARTMENT_OTHER)
Admission: EM | Admit: 2016-10-16 | Discharge: 2016-10-16 | Disposition: A | Discharge status: Home / Self Care | Payer: Self-pay | Attending: Emergency Medicine | Admitting: Emergency Medicine

## 2016-10-16 ENCOUNTER — Encounter (HOSPITAL_BASED_OUTPATIENT_CLINIC_OR_DEPARTMENT_OTHER): Payer: Self-pay | Admitting: *Deleted

## 2016-10-16 DIAGNOSIS — Z87891 Personal history of nicotine dependence: Secondary | ICD-10-CM | POA: Insufficient documentation

## 2016-10-16 DIAGNOSIS — R1031 Right lower quadrant pain: Secondary | ICD-10-CM | POA: Insufficient documentation

## 2016-10-16 DIAGNOSIS — R109 Unspecified abdominal pain: Secondary | ICD-10-CM

## 2016-10-16 DIAGNOSIS — R011 Cardiac murmur, unspecified: Secondary | ICD-10-CM | POA: Insufficient documentation

## 2016-10-16 LAB — URINALYSIS, ROUTINE W REFLEX MICROSCOPIC
Bilirubin Urine: NEGATIVE
Glucose, UA: NEGATIVE mg/dL
HGB URINE DIPSTICK: NEGATIVE
Ketones, ur: NEGATIVE mg/dL
Nitrite: NEGATIVE
PROTEIN: NEGATIVE mg/dL
Specific Gravity, Urine: 1.02 (ref 1.005–1.030)
pH: 5.5 (ref 5.0–8.0)

## 2016-10-16 LAB — URINALYSIS, MICROSCOPIC (REFLEX)

## 2016-10-16 MED ORDER — ONDANSETRON 4 MG PO TBDP: ORAL_TABLET | ORAL | 0 refills | Status: DC

## 2016-10-16 MED ORDER — IBUPROFEN 600 MG PO TABS: 600.0000 mg | ORAL_TABLET | Freq: Four times a day (QID) | ORAL | 0 refills | Status: DC | PRN

## 2016-10-16 MED FILL — ONDANSETRON ODT 4 MG TABLET: 4 | 1 days supply | Qty: 4 | Fill #0

## 2016-10-16 MED FILL — IBUPROFEN 600 MG TABLET: 600 | 7 days supply | Qty: 30 | Fill #0

## 2016-10-16 NOTE — ED Triage Notes (Signed)
Pt states he has had right hip pain for several days, went for a hike yesterday and pain has increased, rates at 3/10.denies any specific injury or trauma, "but I work out every day."

## 2016-10-16 NOTE — ED Provider Notes (Signed)
[facility]   CSN: [id number] Arrival date & time: 10/16/16  0920     History   Chief Complaint Chief Complaint  Patient presents with  . Hip Pain    HPI Alexander Adams is a 26 y.o. male.  Patient is a 26 year old male with a history of reflux to presents with right flank pain. He states it's been hurting for 2 days. He states it's a intermittent pain that comes in waves. It's nonradiating. There is no associated back pain. No abdominal pain. No groin pain. No testicular pain. No fevers. He had a little bit of nausea this morning but felt like it may been something that he ate. No vomiting. No hematuria. No urinary symptoms. No history of trauma to the area. It doesn't seem to be worse with movement. He hasn't taken anything at home for the pain.      Past Medical History:  Diagnosis Date  . GERD (gastroesophageal reflux disease)   . Reflux     Patient Active Problem List   Diagnosis Date Noted  . Foot pain 01/12/2013    History reviewed. No pertinent surgical history.     Home Medications    Prior to Admission medications   Medication Sig Start Date End Date Taking? Authorizing Provider  ibuprofen (ADVIL,MOTRIN) 600 MG tablet Take 1 tablet (600 mg total) by mouth every 6 (six) hours as needed. 10/16/16   Rolan Bucco, MD  Omeprazole (PRILOSEC PO) Take by mouth.    [provider]  ondansetron (ZOFRAN ODT) 4 MG disintegrating tablet 4mg  ODT q4 hours prn nausea/vomit 10/16/16   Rolan Bucco, MD    Family History History reviewed. No pertinent family history.  Social History Social History  Substance Use Topics  . Smoking status: Former Smoker    Quit date: 08/17/2009  . Smokeless tobacco: Never Used  . Alcohol use No     Allergies   Patient has no known allergies.   Review of Systems Review of Systems  Constitutional: Negative for chills, diaphoresis, fatigue and fever.  HENT: Negative for congestion, rhinorrhea  and sneezing.   Eyes: Negative.   Respiratory: Negative for cough, chest tightness and shortness of breath.   Cardiovascular: Negative for chest pain and leg swelling.  Gastrointestinal: Negative for abdominal pain, blood in stool, diarrhea, nausea and vomiting.  Genitourinary: Positive for flank pain. Negative for difficulty urinating, frequency and hematuria.  Musculoskeletal: Negative for arthralgias and back pain.  Skin: Negative for rash.  Neurological: Negative for dizziness, speech difficulty, weakness, numbness and headaches.     Physical Exam Updated Vital Signs BP (!) 138/92 (BP Location: Left Arm)   Pulse 60   Temp 98.1 F (36.7 C) (Oral)   Resp 16   Ht 5\' 9"  (1.753 m)   Wt 83.9 kg (185 lb)   SpO2 98%   BMI 27.32 kg/m   Physical Exam  Constitutional: He is oriented to person, place, and time. He appears well-developed and well-nourished.  HENT:  Head: Normocephalic and atraumatic.  Eyes: Pupils are equal, round, and reactive to light.  Neck: Normal range of motion. Neck supple.  Cardiovascular: Normal rate and regular rhythm.   Murmur heard. Systolic murmur heard best over the apex  Pulmonary/Chest: Effort normal and breath sounds normal. No respiratory distress. He has no wheezes. He has no rales. He exhibits no tenderness.  Abdominal: Soft. Bowel sounds are normal. There is no tenderness. There is no rebound and no guarding.  Patient's area  of tenderness is in the right lateral flank area. There's no CVA tenderness, no reproducible pain on palpation, no rashes or ecchymosis  Genitourinary:  Genitourinary Comments: No pain in inguinal area or testicular area  Musculoskeletal: Normal range of motion. He exhibits no edema.  No pain on range of motion of the right hip, no pain to spinal area  Lymphadenopathy:    He has no cervical adenopathy.  Neurological: He is alert and oriented to person, place, and time.  Skin: Skin is warm and dry. No rash noted.    Psychiatric: He has a normal mood and affect.     ED Treatments / Results  Labs (all labs ordered are listed, but only abnormal results are displayed) Labs Reviewed  URINALYSIS, ROUTINE W REFLEX MICROSCOPIC - Abnormal; Notable for the following:       Result Value   APPearance CLOUDY (*)    Leukocytes, UA TRACE (*)    All other components within normal limits  URINALYSIS, MICROSCOPIC (REFLEX) - Abnormal; Notable for the following:    Bacteria, UA FEW (*)    Squamous Epithelial / LPF 0-5 (*)    All other components within normal limits    EKG  EKG Interpretation None       Radiology No results found.  Procedures Procedures (including critical care time)  Medications Ordered in ED Medications - No data to display   Initial Impression / Assessment and Plan / ED Course  I have reviewed the triage vital signs and the nursing notes.  Pertinent labs & imaging results that were available during my care of the patient were reviewed by me and considered in my medical decision making (see chart for details).     Patient presents with localized pain in his right flank.  There is no associated abdominal pain. No fevers. No vomiting. No urinary symptoms. There's no associated back pain. No signs of shingles. His urine does not show signs of infection or hematuria that would be more concerning for renal colic. He's currently not having any significant pain. He denies any current nausea. He was discharged home in good condition. He was encouraged to establish care with a primary care provider. I did hear a murmur which could be mitral regurgitation although he doesn't have a known history of a murmur. I will give him a list of resources for primary care follow-up. Encouraged him to have a primary care provider for ongoing health care and to follow-up on the murmur.  He was advised that he needs to return here if he has any worsening symptoms including worsening abdominal pain, vomiting  or fevers. He was given a prescription for ibuprofen and he also requested something for nausea in case the nausea comes back. He is given Zofran for this.  Final Clinical Impressions(s) / ED Diagnoses   Final diagnoses:  Acute right flank pain  Murmur, cardiac    New Prescriptions New Prescriptions   IBUPROFEN (ADVIL,MOTRIN) 600 MG TABLET    Take 1 tablet (600 mg total) by mouth every 6 (six) hours as needed.   ONDANSETRON (ZOFRAN ODT) 4 MG DISINTEGRATING TABLET    4mg  ODT q4 hours prn nausea/vomit     Rolan Bucco, MD 10/16/16 1043

## 2018-02-20 ENCOUNTER — Emergency Department (HOSPITAL_BASED_OUTPATIENT_CLINIC_OR_DEPARTMENT_OTHER): Payer: Self-pay

## 2018-02-20 ENCOUNTER — Other Ambulatory Visit: Payer: Self-pay

## 2018-02-20 ENCOUNTER — Encounter (HOSPITAL_BASED_OUTPATIENT_CLINIC_OR_DEPARTMENT_OTHER): Payer: Self-pay | Admitting: Emergency Medicine

## 2018-02-20 ENCOUNTER — Emergency Department (HOSPITAL_BASED_OUTPATIENT_CLINIC_OR_DEPARTMENT_OTHER)
Admission: EM | Admit: 2018-02-20 | Discharge: 2018-02-20 | Disposition: A | Discharge status: Home / Self Care | Payer: Self-pay | Attending: Emergency Medicine | Admitting: Emergency Medicine

## 2018-02-20 DIAGNOSIS — Z79899 Other long term (current) drug therapy: Secondary | ICD-10-CM | POA: Insufficient documentation

## 2018-02-20 DIAGNOSIS — Z87891 Personal history of nicotine dependence: Secondary | ICD-10-CM | POA: Insufficient documentation

## 2018-02-20 DIAGNOSIS — M79641 Pain in right hand: Secondary | ICD-10-CM | POA: Insufficient documentation

## 2018-02-20 MED ORDER — IBUPROFEN 600 MG PO TABS: 600.0000 mg | ORAL_TABLET | Freq: Four times a day (QID) | ORAL | 0 refills | Status: DC | PRN

## 2018-02-20 MED ORDER — ACETAMINOPHEN 500 MG PO TABS: 500.0000 mg | ORAL_TABLET | Freq: Four times a day (QID) | ORAL | 0 refills | Status: DC | PRN

## 2018-02-20 MED ORDER — IBUPROFEN 400 MG PO TABS
600.0000 mg | ORAL_TABLET | Freq: Once | ORAL | Status: AC
  Administered 2018-02-20: 11:00:00 600 mg via ORAL
  Filled 2018-02-20: qty 1

## 2018-02-20 NOTE — ED Notes (Signed)
ED Provider at bedside. 

## 2018-02-20 NOTE — ED Triage Notes (Signed)
Slight swelling to palm of right hand for a couple weeks that seemed to be related to phone use.  It got a little better but last night it got "really bad" and woke him up.

## 2018-02-20 NOTE — Discharge Instructions (Signed)
Take ibuprofen every 6 hours as prescribed.  Alternate with Tylenol as needed for your pain.  Use ice 3-4 times daily alternating 20 minutes on, 20 minutes off.  Wear the Ace wrap at all times, especially when using your hand.  Do not do any heavy lifting or repetitive motions with your hand.  Try to rested as much as possible.  Please follow-up with Dr. Pearletha ForgeHudnall if your symptoms are not improving.  Please return the emergency department you develop any redness, red streaking from the area, increasing swelling or pain, fevers over 100.4, or any other concerning symptoms.

## 2018-02-20 NOTE — ED Provider Notes (Signed)
[facility] EMERGENCY DEPARTMENT Provider Note   CSN: [id number] Arrival date & time: 02/20/18  1037     History   Chief Complaint Chief Complaint  Patient presents with  . Hand Problem    HPI Alexander Adams is a 27 y.o. male with history of GERD who presents with a 3-week history of right hand pain and swelling.  He reports initially, he felt that it was related to how he was holding his phone and he stopped holding his phone that when it started to improve.  However, over the past week or so, he has been doing exercises at the gym that require him to grip and hold a weight in his body weight.  Since then, it has been much more painful.  He reports it was keeping him awake last night.  He did not take any medication or tried any interventions prior to arrival.  He denies any IVDU, fevers, trauma.  HPI  Past Medical History:  Diagnosis Date  . GERD (gastroesophageal reflux disease)   . Reflux     Patient Active Problem List   Diagnosis Date Noted  . Foot pain 01/12/2013    History reviewed. No pertinent surgical history.      Home Medications    Prior to Admission medications   Medication Sig Start Date End Date Taking? Authorizing Provider  acetaminophen (TYLENOL) 500 MG tablet Take 1 tablet (500 mg total) by mouth every 6 (six) hours as needed. 02/20/18   Kiandra Sanguinetti, Waylan BogaAlexandra M, PA-C  ibuprofen (ADVIL,MOTRIN) 600 MG tablet Take 1 tablet (600 mg total) by mouth every 6 (six) hours as needed. 02/20/18   Amonie Wisser, Waylan BogaAlexandra M, PA-C  Omeprazole (PRILOSEC PO) Take by mouth.    [provider]  ondansetron (ZOFRAN ODT) 4 MG disintegrating tablet 4mg  ODT q4 hours prn nausea/vomit 10/16/16   Rolan BuccoBelfi, Melanie, MD    Family History No family history on file.  Social History Social History   Tobacco Use  . Smoking status: Former Smoker    Last attempt to quit: 08/17/2009    Years since quitting: 8.5  . Smokeless tobacco: Never Used  Substance Use Topics  . Alcohol  use: No  . Drug use: No     Allergies   Patient has no known allergies.   Review of Systems Review of Systems  Constitutional: Negative for fever.  Musculoskeletal: Positive for arthralgias and joint swelling.  Neurological: Negative for numbness.     Physical Exam Updated Vital Signs BP (!) 147/65 (BP Location: Left Arm)   Pulse 60   Temp 98.3 F (36.8 C) (Oral)   Resp 16   Ht 5\' 9"  (1.753 m)   Wt 81.6 kg   SpO2 97%   BMI 26.58 kg/m   Physical Exam Vitals signs and nursing note reviewed.  Constitutional:      General: He is not in acute distress.    Appearance: He is well-developed. He is not diaphoretic.  HENT:     Head: Normocephalic and atraumatic.     Mouth/Throat:     Pharynx: No oropharyngeal exudate.  Eyes:     General: No scleral icterus.       Right eye: No discharge.        Left eye: No discharge.     Conjunctiva/sclera: Conjunctivae normal.     Pupils: Pupils are equal, round, and reactive to light.  Neck:     Musculoskeletal: Normal range of motion and neck supple.  Thyroid: No thyromegaly.  Cardiovascular:     Rate and Rhythm: Normal rate and regular rhythm.     Heart sounds: Normal heart sounds. No murmur. No friction rub. No gallop.   Pulmonary:     Effort: Pulmonary effort is normal. No respiratory distress.     Breath sounds: Normal breath sounds. No stridor. No wheezing or rales.  Abdominal:     Palpations: Abdomen is soft.  Musculoskeletal:     Comments: Patient with edema and tenderness to the palmar aspect of the fifth metacarpal and fifth MCP, and there is no erythema, pain is mostly with extension of the fifth digit, there is no edema in the fifth digit, but only in the palmar aspect, sensation is intact, cap refill less than 2 seconds, no tenderness to the wrist or elsewhere in the hand  Lymphadenopathy:     Cervical: No cervical adenopathy.  Skin:    General: Skin is warm and dry.     Coloration: Skin is not pale.      Findings: No rash.  Neurological:     Mental Status: He is alert.     Coordination: Coordination normal.      ED Treatments / Results  Labs (all labs ordered are listed, but only abnormal results are displayed) Labs Reviewed - No data to display  EKG None  Radiology Dg Hand Complete Right  Result Date: 02/20/2018 CLINICAL DATA:  Ulnar-sided hand pain and swelling. EXAM: RIGHT HAND - COMPLETE 3+ VIEW COMPARISON:  None. FINDINGS: There is no evidence of fracture or dislocation. There is no evidence of arthropathy or other focal bone abnormality. Soft tissues are unremarkable. IMPRESSION: Negative. Electronically Signed   By: Kennith CenterEric  Mansell M.D.   On: 02/20/2018 11:26    Procedures Procedures (including critical care time)  Medications Ordered in ED Medications  ibuprofen (ADVIL,MOTRIN) tablet 600 mg (600 mg Oral Given 02/20/18 1100)     Initial Impression / Assessment and Plan / ED Course  I have reviewed the triage vital signs and the nursing notes.  Pertinent labs & imaging results that were available during my care of the patient were reviewed by me and considered in my medical decision making (see chart for details).     Patient with suspected tendinitis versus muscle strain.  X-ray of the right hand is negative. Patient is feeling better with ice and NSAIDs in the ED. Patient has swelling only in the right muscle belly.  He has full range of motion of the digit.  Very low suspicion of tenosynovitis.  Patient reports his pain improved until he started doing heavy lifting with gripping a weight in the right hand.  Patient advised to rest and stop doing heavy lifting with the hand.  Ice, NSAIDs, Ace wrap discussed.  Follow-up to Dr. Pearletha ForgeHudnall with sports medicine as needed.  Patient vitals stable throughout ED course and discharged in satisfactory condition.  Final Clinical Impressions(s) / ED Diagnoses   Final diagnoses:  Right hand pain    ED Discharge Orders          Ordered    ibuprofen (ADVIL,MOTRIN) 600 MG tablet  Every 6 hours PRN     02/20/18 1206    acetaminophen (TYLENOL) 500 MG tablet  Every 6 hours PRN     02/20/18 1206           LawWaylan Boga, Gaspare Netzel M, PA-C 02/20/18 1208    Doug SouJacubowitz, Sam, MD 02/20/18 [phone]

## 2019-02-12 ENCOUNTER — Other Ambulatory Visit: Payer: Self-pay

## 2019-02-12 ENCOUNTER — Encounter (HOSPITAL_BASED_OUTPATIENT_CLINIC_OR_DEPARTMENT_OTHER): Payer: Self-pay | Admitting: Emergency Medicine

## 2019-02-12 ENCOUNTER — Emergency Department (HOSPITAL_BASED_OUTPATIENT_CLINIC_OR_DEPARTMENT_OTHER)
Admission: EM | Admit: 2019-02-12 | Discharge: 2019-02-12 | Disposition: A | Discharge status: Home / Self Care | Payer: Self-pay | Attending: Emergency Medicine | Admitting: Emergency Medicine

## 2019-02-12 DIAGNOSIS — Z87891 Personal history of nicotine dependence: Secondary | ICD-10-CM | POA: Insufficient documentation

## 2019-02-12 DIAGNOSIS — U071 COVID-19: Secondary | ICD-10-CM | POA: Insufficient documentation

## 2019-02-12 DIAGNOSIS — R112 Nausea with vomiting, unspecified: Secondary | ICD-10-CM

## 2019-02-12 LAB — SARS CORONAVIRUS 2 AG (30 MIN TAT): SARS Coronavirus 2 Ag: POSITIVE — AB

## 2019-02-12 MED ORDER — ACETAMINOPHEN 500 MG PO TABS: 1000.0000 mg | ORAL_TABLET | Freq: Once | ORAL | Status: DC

## 2019-02-12 MED ORDER — ACETAMINOPHEN 325 MG PO TABS
650.0000 mg | ORAL_TABLET | Freq: Once | ORAL | Status: AC | PRN
  Administered 2019-02-12: 650 mg via ORAL
  Filled 2019-02-12: qty 2

## 2019-02-12 MED ORDER — ONDANSETRON 8 MG PO TBDP
8.0000 mg | ORAL_TABLET | Freq: Once | ORAL | Status: AC
  Administered 2019-02-12: 03:00:00 8 mg via ORAL
  Filled 2019-02-12: qty 1

## 2019-02-12 NOTE — ED Notes (Signed)
ED Provider at bedside. 

## 2019-02-12 NOTE — Discharge Instructions (Signed)
Person Under Monitoring Name: Alexander Adams  Location: [place] [address]   Infection Prevention Recommendations for Individuals Confirmed to have, or Being Evaluated for, 2019 Novel Coronavirus (COVID-19) Infection Who Receive Care at Home  Individuals who are confirmed to have, or are being evaluated for, COVID-19 should follow the prevention steps below until a healthcare provider or local or state health department says they can return to normal activities.  Stay home except to get medical care You should restrict activities outside your home, except for getting medical care. Do not go to work, school, or public areas, and do not use public transportation or taxis.  Call ahead before visiting your doctor Before your medical appointment, call the healthcare provider and tell them that you have, or are being evaluated for, COVID-19 infection. This will help the healthcare provider's office take steps to keep other people from getting infected. Ask your healthcare provider to call the local or state health department.  Monitor your symptoms Seek prompt medical attention if your illness is worsening (e.g., difficulty breathing). Before going to your medical appointment, call the healthcare provider and tell them that you have, or are being evaluated for, COVID-19 infection. Ask your healthcare provider to call the local or state health department.  Wear a facemask You should wear a facemask that covers your nose and mouth when you are in the same room with other people and when you visit a healthcare provider. People who live with or visit you should also wear a facemask while they are in the same room with you.  Separate yourself from other people in your home As much as possible, you should stay in a different room from other people in your home. Also, you should use a separate bathroom, if available.  Avoid sharing household items You should not share  dishes, drinking glasses, cups, eating utensils, towels, bedding, or other items with other people in your home. After using these items, you should wash them thoroughly with soap and water.  Cover your coughs and sneezes Cover your mouth and nose with a tissue when you cough or sneeze, or you can cough or sneeze into your sleeve. Throw used tissues in a lined trash can, and immediately wash your hands with soap and water for at least 20 seconds or use an alcohol-based hand rub.  Wash your [facility] your hands often and thoroughly with soap and water for at least 20 seconds. You can use an alcohol-based hand sanitizer if soap and water are not available and if your hands are not visibly dirty. Avoid touching your eyes, nose, and mouth with unwashed hands.   Prevention Steps for Caregivers and Household Members of Individuals Confirmed to have, or Being Evaluated for, COVID-19 Infection Being Cared for in the Home  If you live with, or provide care at home for, a person confirmed to have, or being evaluated for, COVID-19 infection please follow these guidelines to prevent infection:  Follow healthcare provider's instructions Make sure that you understand and can help the patient follow any healthcare provider instructions for all care.  Provide for the patient's basic needs You should help the patient with basic needs in the home and provide support for getting groceries, prescriptions, and other personal needs.  Monitor the patient's symptoms If they are getting sicker, call his or her medical provider and tell them that the patient has, or is being evaluated for, COVID-19 infection. This will help the healthcare provider's  office take steps to keep other people from getting infected. Ask the healthcare provider to call the local or state health department.  Limit the number of people who have contact with the patient If possible, have only one caregiver for the patient. Other  household members should stay in another home or place of residence. If this is not possible, they should stay in another room, or be separated from the patient as much as possible. Use a separate bathroom, if available. Restrict visitors who do not have an essential need to be in the home.  Keep older adults, very [place] children, and other sick people away from the patient Keep older adults, very [place] children, and those who have compromised immune systems or chronic health conditions away from the patient. This includes people with chronic heart, lung, or kidney conditions, diabetes, and cancer.  Ensure good ventilation Make sure that shared spaces in the home have good air flow, such as from an air conditioner or an opened window, weather permitting.  Wash your hands often Wash your hands often and thoroughly with soap and water for at least 20 seconds. You can use an alcohol based hand sanitizer if soap and water are not available and if your hands are not visibly dirty. Avoid touching your eyes, nose, and mouth with unwashed hands. Use disposable paper towels to dry your hands. If not available, use dedicated cloth towels and replace them when they become wet.  Wear a facemask and gloves Wear a disposable facemask at all times in the room and gloves when you touch or have contact with the patient's blood, body fluids, and/or secretions or excretions, such as sweat, saliva, sputum, nasal mucus, vomit, urine, or feces.  Ensure the mask fits over your nose and mouth tightly, and do not touch it during use. Throw out disposable facemasks and gloves after using them. Do not reuse. Wash your hands immediately after removing your facemask and gloves. If your personal clothing becomes contaminated, carefully remove clothing and launder. Wash your hands after handling contaminated clothing. Place all used disposable facemasks, gloves, and other waste in a lined container before disposing them with  other household waste. Remove gloves and wash your hands immediately after handling these items.  Do not share dishes, glasses, or other household items with the patient Avoid sharing household items. You should not share dishes, drinking glasses, cups, eating utensils, towels, bedding, or other items with a patient who is confirmed to have, or being evaluated for, COVID-19 infection. After the person uses these items, you should wash them thoroughly with soap and water.  Wash laundry thoroughly Immediately remove and wash clothes or bedding that have blood, body fluids, and/or secretions or excretions, such as sweat, saliva, sputum, nasal mucus, vomit, urine, or feces, on them. Wear gloves when handling laundry from the patient. Read and follow directions on labels of laundry or clothing items and detergent. In general, wash and dry with the warmest temperatures recommended on the label.  Clean all areas the individual has used often Clean all touchable surfaces, such as counters, tabletops, doorknobs, bathroom fixtures, toilets, phones, keyboards, tablets, and bedside tables, every day. Also, clean any surfaces that may have blood, body fluids, and/or secretions or excretions on them. Wear gloves when cleaning surfaces the patient has come in contact with. Use a diluted bleach solution (e.g., dilute bleach with 1 part bleach and 10 parts water) or a household disinfectant with a label that says EPA-registered for coronaviruses. To make a  bleach solution at home, add 1 tablespoon of bleach to 1 quart (4 cups) of water. For a larger supply, add  cup of bleach to 1 gallon (16 cups) of water. Read labels of cleaning products and follow recommendations provided on product labels. Labels contain instructions for safe and effective use of the cleaning product including precautions you should take when applying the product, such as wearing gloves or eye protection and making sure you have good ventilation  during use of the product. Remove gloves and wash hands immediately after cleaning.  Monitor yourself for signs and symptoms of illness Caregivers and household members are considered close contacts, should monitor their health, and will be asked to limit movement outside of the home to the extent possible. Follow the monitoring steps for close contacts listed on the symptom monitoring form.   ? If you have additional questions, contact your local health department or call the epidemiologist on call at [phone] (available 24/7). ? This guidance is subject to change. For the most up-to-date guidance from [facility], please refer to their website: [email or web address]

## 2019-02-12 NOTE — ED Provider Notes (Signed)
[facility] EMERGENCY DEPARTMENT Provider Note   CSN: [id number] Arrival date & time: 02/12/19  0301     History Chief Complaint  Patient presents with  . Emesis    Alexander Adams is a 28 y.o. male.  The history is provided by the patient.  Emesis Severity:  Mild Timing:  Rare Quality:  Stomach contents Progression:  Resolved Chronicity:  New Recent urination:  Normal Relieved by:  Nothing Ineffective treatments:  None tried Associated symptoms: no abdominal pain, no arthralgias, no chills, no cough, no diarrhea, no headaches, no myalgias, no sore throat and no URI   Associated symptoms comment:  Patient did not know he had a fever  Risk factors: no alcohol use   Patient had chick fila this evening and thought he had food poisoning.  Feels fatigued.  Denies anosmia, denies loss of taste.  Denies diarrhea, denies cough and SOB.       Past Medical History:  Diagnosis Date  . GERD (gastroesophageal reflux disease)   . Reflux     Patient Active Problem List   Diagnosis Date Noted  . Foot pain 01/12/2013    History reviewed. No pertinent surgical history.     Family History  Problem Relation Age of Onset  . Diabetes Father   . Hypertension Father     Social History   Tobacco Use  . Smoking status: Former Smoker    Quit date: 08/17/2009    Years since quitting: 9.4  . Smokeless tobacco: Never Used  Substance Use Topics  . Alcohol use: No  . Drug use: No    Home Medications Prior to Admission medications   Medication Sig Start Date End Date Taking? Authorizing Provider  acetaminophen (TYLENOL) 500 MG tablet Take 1 tablet (500 mg total) by mouth every 6 (six) hours as needed. 02/20/18   Law, Waylan Boga, PA-C  ibuprofen (ADVIL,MOTRIN) 600 MG tablet Take 1 tablet (600 mg total) by mouth every 6 (six) hours as needed. 02/20/18   Law, Waylan Boga, PA-C  Omeprazole (PRILOSEC PO) Take by mouth.    [provider]  ondansetron (ZOFRAN ODT) 4  MG disintegrating tablet 4mg  ODT q4 hours prn nausea/vomit 10/16/16   10/18/16, MD    Allergies    Patient has no known allergies.  Review of Systems   Review of Systems  Constitutional: Negative for chills.  HENT: Negative for congestion and sore throat.   Eyes: Negative for visual disturbance.  Respiratory: Negative for cough and shortness of breath.   Gastrointestinal: Positive for nausea and vomiting. Negative for abdominal pain and diarrhea.  Genitourinary: Negative for difficulty urinating.  Musculoskeletal: Negative for arthralgias and myalgias.  Neurological: Negative for headaches.  Psychiatric/Behavioral: Negative for agitation.  All other systems reviewed and are negative.   Physical Exam Updated Vital Signs BP (!) 142/82 (BP Location: Right Arm)   Pulse 92   Temp (!) 101 F (38.3 C) (Oral)   Resp 16   Ht 5\' 8"  (1.727 m)   Wt 83.9 kg   SpO2 98%   BMI 28.13 kg/m   Physical Exam  ED Results / Procedures / Treatments   Labs (all labs ordered are listed, but only abnormal results are displayed) Labs Reviewed  SARS CORONAVIRUS 2 AG (30 MIN TAT) - Abnormal; Notable for the following components:      Result Value   SARS Coronavirus 2 Ag POSITIVE (*)    All other components within normal limits  SARS CORONAVIRUS 2 (  TAT 6-24 HRS)    EKG None  Radiology No results found.  Procedures Procedures (including critical care time)  Medications Ordered in ED Medications  ondansetron (ZOFRAN-ODT) disintegrating tablet 8 mg (8 mg Oral Given 02/12/19 0318)  acetaminophen (TYLENOL) tablet 650 mg (650 mg Oral Given 02/12/19 0347)    ED Course  I have reviewed the triage vital signs and the nursing notes.  Pertinent labs & imaging results that were available during my care of the patient were reviewed by me and considered in my medical decision making (see chart for details).   Patient is PO challenging.  Lungs are clear.  Normal O2 saturation.  No pulmonary  complaints.  No loss of taste of smell.  Will be placed on strict home isolation for 2 weeks.  Isolation agreement both signed and provided.  Work note provided.  Have advised tylenol for fevers and pain and home pulse oxygen monitor.  May take vitamin C and zinc.    Alexander Adams was evaluated in Emergency Department on 02/12/2019 for the symptoms described in the history of present illness. He was evaluated in the context of the global COVID-19 pandemic, which necessitated consideration that the patient might be at risk for infection with the SARS-CoV-2 virus that causes COVID-19. Institutional protocols and algorithms that pertain to the evaluation of patients at risk for COVID-19 are in a state of rapid change based on information released by regulatory bodies including the CDC and federal and state organizations. These policies and algorithms were followed during the patient's care in the ED.  Final Clinical Impression(s) / ED Diagnoses Final diagnoses:  COVID-19  Non-intractable vomiting with nausea, unspecified vomiting type   Return for intractable cough, coughing up blood,fevers >100.4 unrelieved by medication, shortness of breath, intractable vomiting, chest pain, shortness of breath, weakness,numbness, changes in speech, facial asymmetry,abdominal pain, passing out,Inability to tolerate liquids or food, cough, altered mental status or any concerns. No signs of systemic illness or infection. The patient is nontoxic-appearing on exam and vital signs are within normal limits.   I have reviewed the triage vital signs and the nursing notes. Pertinent labs &imaging results that were available during my care of the patient were reviewed by me and considered in my medical decision making (see chart for details).  After history, exam, and medical workup I feel the patient has been appropriately medically screened and is safe for discharge home. Pertinent diagnoses were discussed with the  patient. Patient was given return     Krystena Reitter, MD 02/12/19 [phone]

## 2019-02-12 NOTE — ED Triage Notes (Signed)
Pt states he woke up this morning around 0130 this morning with nausea and vomiting  Pt denies diarrhea  States he feels weak and light headed  States he has had 2 episodes of vomiting   Denies any abd pain

## 2019-02-15 ENCOUNTER — Other Ambulatory Visit: Payer: Self-pay

## 2019-02-15 ENCOUNTER — Emergency Department (HOSPITAL_BASED_OUTPATIENT_CLINIC_OR_DEPARTMENT_OTHER)
Admission: EM | Admit: 2019-02-15 | Discharge: 2019-02-15 | Disposition: A | Discharge status: Home / Self Care | Payer: Self-pay | Attending: Emergency Medicine | Admitting: Emergency Medicine

## 2019-02-15 ENCOUNTER — Encounter (HOSPITAL_BASED_OUTPATIENT_CLINIC_OR_DEPARTMENT_OTHER): Payer: Self-pay | Admitting: Emergency Medicine

## 2019-02-15 DIAGNOSIS — R748 Abnormal levels of other serum enzymes: Secondary | ICD-10-CM | POA: Insufficient documentation

## 2019-02-15 DIAGNOSIS — R112 Nausea with vomiting, unspecified: Secondary | ICD-10-CM

## 2019-02-15 DIAGNOSIS — R7989 Other specified abnormal findings of blood chemistry: Secondary | ICD-10-CM | POA: Insufficient documentation

## 2019-02-15 DIAGNOSIS — U071 COVID-19: Secondary | ICD-10-CM | POA: Insufficient documentation

## 2019-02-15 DIAGNOSIS — Z87891 Personal history of nicotine dependence: Secondary | ICD-10-CM | POA: Insufficient documentation

## 2019-02-15 DIAGNOSIS — R1013 Epigastric pain: Secondary | ICD-10-CM | POA: Insufficient documentation

## 2019-02-15 LAB — CBC
HCT: 43.7 % (ref 39.0–52.0)
Hemoglobin: 14.7 g/dL (ref 13.0–17.0)
MCH: 28.3 pg (ref 26.0–34.0)
MCHC: 33.6 g/dL (ref 30.0–36.0)
MCV: 84.2 fL (ref 80.0–100.0)
Platelets: 228 10*3/uL (ref 150–400)
RBC: 5.19 MIL/uL (ref 4.22–5.81)
RDW: 12.4 % (ref 11.5–15.5)
WBC: 5.1 10*3/uL (ref 4.0–10.5)
nRBC: 0 % (ref 0.0–0.2)

## 2019-02-15 LAB — COMPREHENSIVE METABOLIC PANEL
ALT: 37 U/L (ref 0–44)
AST: 26 U/L (ref 15–41)
Albumin: 4.6 g/dL (ref 3.5–5.0)
Alkaline Phosphatase: 56 U/L (ref 38–126)
Anion gap: 13 (ref 5–15)
BUN: 19 mg/dL (ref 6–20)
CO2: 21 mmol/L — ABNORMAL LOW (ref 22–32)
Calcium: 9.6 mg/dL (ref 8.9–10.3)
Chloride: 105 mmol/L (ref 98–111)
Creatinine, Ser: 0.82 mg/dL (ref 0.61–1.24)
GFR calc Af Amer: >60 mL/min (ref >60)
GFR calc non Af Amer: >60 mL/min (ref >60)
Glucose, Bld: 115 mg/dL — ABNORMAL HIGH (ref 70–99)
Potassium: 3.8 mmol/L (ref 3.5–5.1)
Sodium: 139 mmol/L (ref 135–145)
Total Bilirubin: 0.6 mg/dL (ref 0.3–1.2)
Total Protein: 7.8 g/dL (ref 6.5–8.1)

## 2019-02-15 LAB — LIPASE, BLOOD: Lipase: 129 U/L — ABNORMAL HIGH (ref 11–51)

## 2019-02-15 MED ORDER — SODIUM CHLORIDE 0.9 % IV BOLUS
1000.0000 mL | Freq: Once | INTRAVENOUS | Status: AC
  Administered 2019-02-15: 1000 mL via INTRAVENOUS

## 2019-02-15 MED ORDER — ONDANSETRON 4 MG PO TBDP: 4.0000 mg | ORAL_TABLET | Freq: Three times a day (TID) | ORAL | 0 refills | Status: DC | PRN

## 2019-02-15 MED ORDER — ONDANSETRON HCL 4 MG/2ML IJ SOLN
4.0000 mg | Freq: Once | INTRAMUSCULAR | Status: AC
  Administered 2019-02-15: 08:00:00 4 mg via INTRAVENOUS
  Filled 2019-02-15: qty 2

## 2019-02-15 NOTE — ED Notes (Signed)
Pt states unable to provide urine at this time

## 2019-02-15 NOTE — ED Triage Notes (Signed)
COVID+, c/o N/V. States he has lost 10lbs this week.

## 2019-02-15 NOTE — ED Notes (Signed)
Patient verbalizes understanding of discharge instructions. Opportunity for questioning and answers were provided. Armband removed by staff, pt discharged from ED.  

## 2019-02-15 NOTE — ED Notes (Signed)
Pt given water for PO challenge 

## 2019-02-15 NOTE — ED Provider Notes (Signed)
[place] EMERGENCY DEPARTMENT Provider Note   CSN: [id number] Arrival date & time: 02/15/19  0720     History Chief Complaint  Patient presents with  . Emesis    COVID+    Alexander Adams is a 28 y.o. male.  The history is provided by the patient and medical records. No language interpreter was used.  Emesis  Alexander Adams is a 28 y.o. male who presents to the Emergency Department complaining of nausea, vomiting and dehydration. He presents to the emergency department complaining of severe nausea and vomiting that began Tuesday night. He has significant epigastric discomfort does not describe it as pain. He was seen in the emergency department on Tuesday night and diagnosed with COVID-19 infection. He does have ongoing fevers at home that improved with Tylenol. He has persistent severe nausea but his vomiting has decreased. He feels dehydrated and cannot eat or drink anything. He has been vomiting up to four times daily. He denies any diarrhea, dysuria, cough, shortness of breath. He has no medical problems. Symptoms are moderate to severe and constant nature.    Past Medical History:  Diagnosis Date  . GERD (gastroesophageal reflux disease)   . Reflux     Patient Active Problem List   Diagnosis Date Noted  . Foot pain 01/12/2013    History reviewed. No pertinent surgical history.     Family History  Problem Relation Age of Onset  . Diabetes Father   . Hypertension Father     Social History   Tobacco Use  . Smoking status: Former Smoker    Quit date: 08/17/2009    Years since quitting: 9.5  . Smokeless tobacco: Never Used  Substance Use Topics  . Alcohol use: No  . Drug use: No    Home Medications Prior to Admission medications   Medication Sig Start Date End Date Taking? Authorizing Provider  acetaminophen (TYLENOL) 500 MG tablet Take 1 tablet (500 mg total) by mouth every 6 (six) hours as needed. 02/20/18   Law, Bea Graff, PA-C  ibuprofen  (ADVIL,MOTRIN) 600 MG tablet Take 1 tablet (600 mg total) by mouth every 6 (six) hours as needed. 02/20/18   Law, Bea Graff, PA-C  Omeprazole (PRILOSEC PO) Take by mouth.    [provider]  ondansetron (ZOFRAN ODT) 4 MG disintegrating tablet 4mg  ODT q4 hours prn nausea/vomit 10/16/16   Malvin Johns, MD  ondansetron (ZOFRAN ODT) 4 MG disintegrating tablet Take 1 tablet (4 mg total) by mouth every 8 (eight) hours as needed for nausea or vomiting. 02/15/19   Quintella Reichert, MD    Allergies    Patient has no known allergies.  Review of Systems   Review of Systems  Gastrointestinal: Positive for vomiting.  All other systems reviewed and are negative.   Physical Exam Updated Vital Signs BP 134/86 (BP Location: Right Arm)   Pulse 71   Temp 100 F (37.8 C) (Oral)   Resp 16   Ht 5\' 8"  (1.727 m)   Wt 80.3 kg   SpO2 100%   BMI 26.91 kg/m   Physical Exam Vitals and nursing note reviewed.  Constitutional:      Appearance: He is well-developed.  HENT:     Head: Normocephalic and atraumatic.  Cardiovascular:     Rate and Rhythm: Normal rate and regular rhythm.  Pulmonary:     Effort: Pulmonary effort is normal. No respiratory distress.  Abdominal:     Palpations: Abdomen is soft.  Tenderness: There is no guarding or rebound.     Comments: Mild epigastric tenderness  Musculoskeletal:        General: No swelling or tenderness.  Skin:    General: Skin is warm and dry.  Neurological:     Mental Status: He is alert and oriented to person, place, and time.  Psychiatric:        Behavior: Behavior normal.     ED Results / Procedures / Treatments   Labs (all labs ordered are listed, but only abnormal results are displayed) Labs Reviewed  LIPASE, BLOOD - Abnormal; Notable for the following components:      Result Value   Lipase 129 (*)    All other components within normal limits  COMPREHENSIVE METABOLIC PANEL - Abnormal; Notable for the following components:    CO2 21 (*)    Glucose, Bld 115 (*)    All other components within normal limits  CBC    EKG None  Radiology No results found.  Procedures Procedures (including critical care time)  Medications Ordered in ED Medications  ondansetron (ZOFRAN) injection 4 mg (4 mg Intravenous Given 02/15/19 0802)  sodium chloride 0.9 % bolus 1,000 mL (0 mLs Intravenous Stopped 02/15/19 0839)  sodium chloride 0.9 % bolus 1,000 mL (0 mLs Intravenous Stopped 02/15/19 1024)    ED Course  I have reviewed the triage vital signs and the nursing notes.  Pertinent labs & imaging results that were available during my care of the patient were reviewed by me and considered in my medical decision making (see chart for details).    MDM Rules/Calculators/A&P                      Patient here for evaluation of nausea, vomiting, recent diagnosis of COVID-19 infection. He is non-toxic appearing on evaluation with no significant abdominal tenderness. Labs significant for mild elevation of lipase. Presentation is not consistent with pancreatitis, bowel obstruction, perforated viscous. Long treatment with IV fluids and antiemetics he is feeling improved, tolerating oral fluids. Counseled patient on home care for nausea, vomiting, dehydration as well as COVID. Discussed return precautions.  [name] was evaluated in Emergency Department on 02/15/2019 for the symptoms described in the history of present illness. He was evaluated in the context of the global COVID-19 pandemic, which necessitated consideration that the patient might be at risk for infection with the SARS-CoV-2 virus that causes COVID-19. Institutional protocols and algorithms that pertain to the evaluation of patients at risk for COVID-19 are in a state of rapid change based on information released by regulatory bodies including the CDC and federal and state organizations. These policies and algorithms were followed during the patient's care in the  ED.  Final Clinical Impression(s) / ED Diagnoses Final diagnoses:  Non-intractable vomiting with nausea, unspecified vomiting type  COVID-19    Rx / DC Orders ED Discharge Orders         Ordered    ondansetron (ZOFRAN ODT) 4 MG disintegrating tablet  Every 8 hours PRN     02/15/19 1022           Tilden Fossa, MD 02/15/19 1256

## 2019-02-20 ENCOUNTER — Other Ambulatory Visit: Payer: Self-pay

## 2020-01-23 IMAGING — DX DG HAND COMPLETE 3+V*R*
3 series · 3 of 3 positions shown · non-contrast
Comparison: None.

CLINICAL DATA: Ulnar-sided hand pain and swelling.

EXAM:
RIGHT HAND - COMPLETE 3+ VIEW

[hand ap]
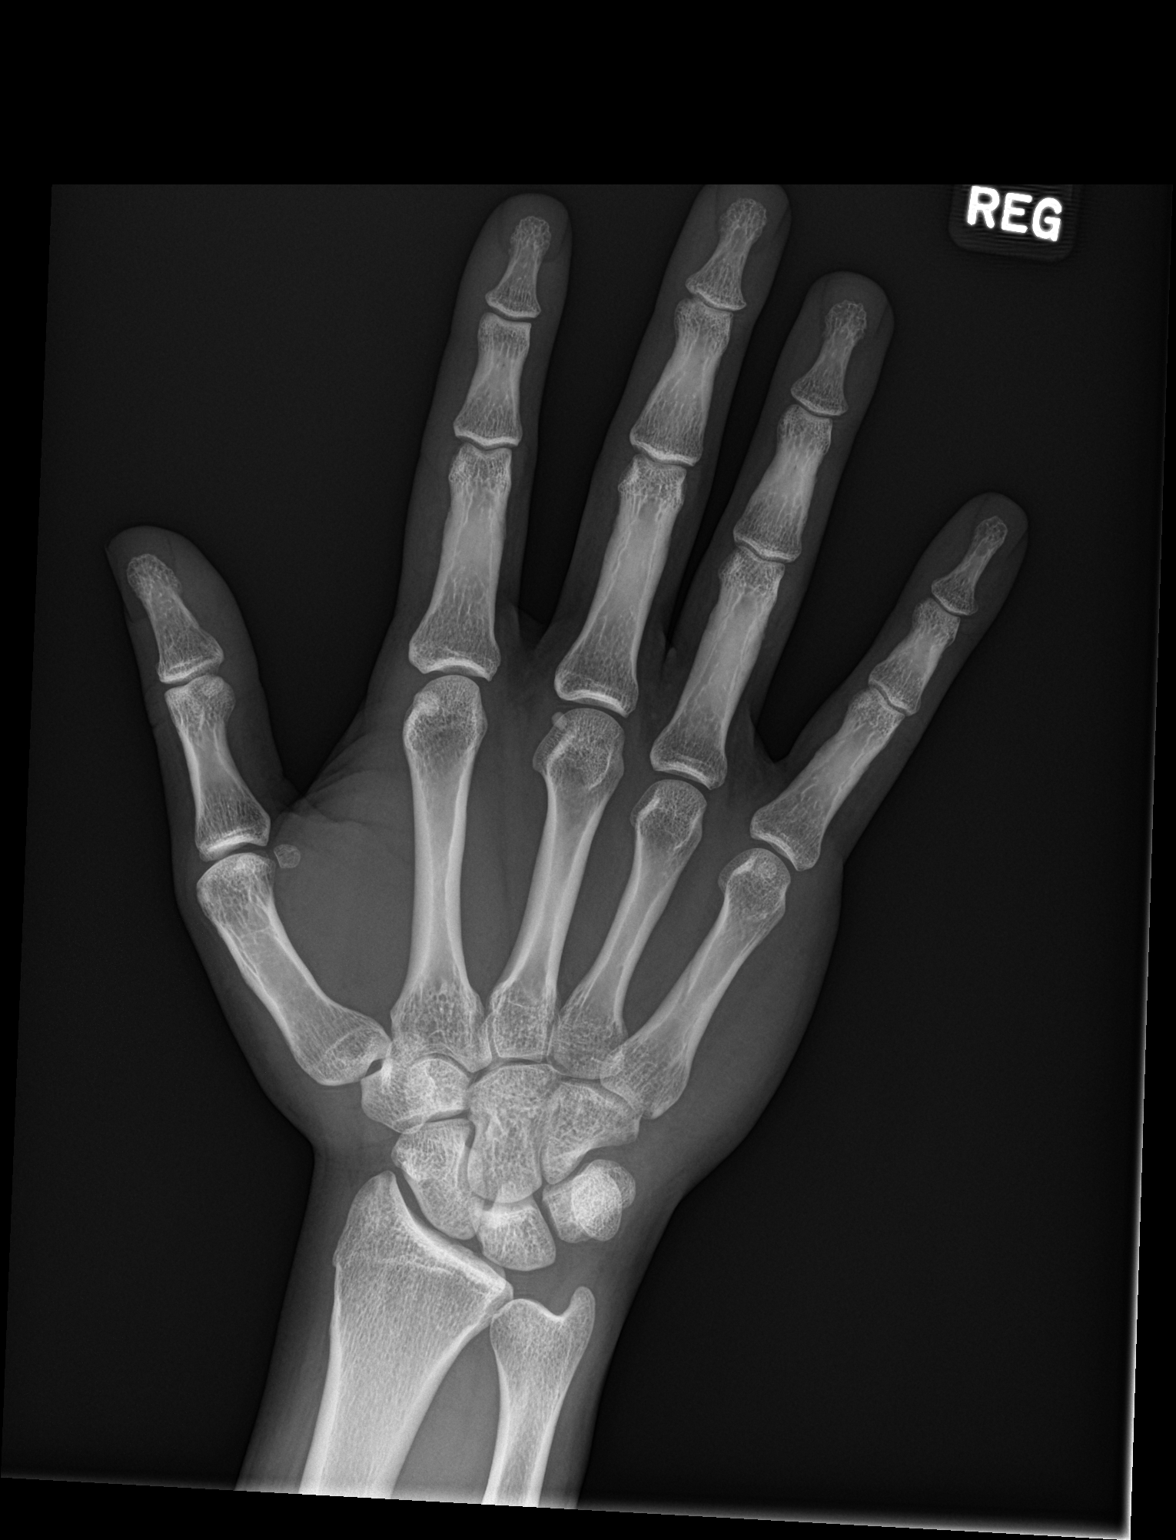

[hand obl]
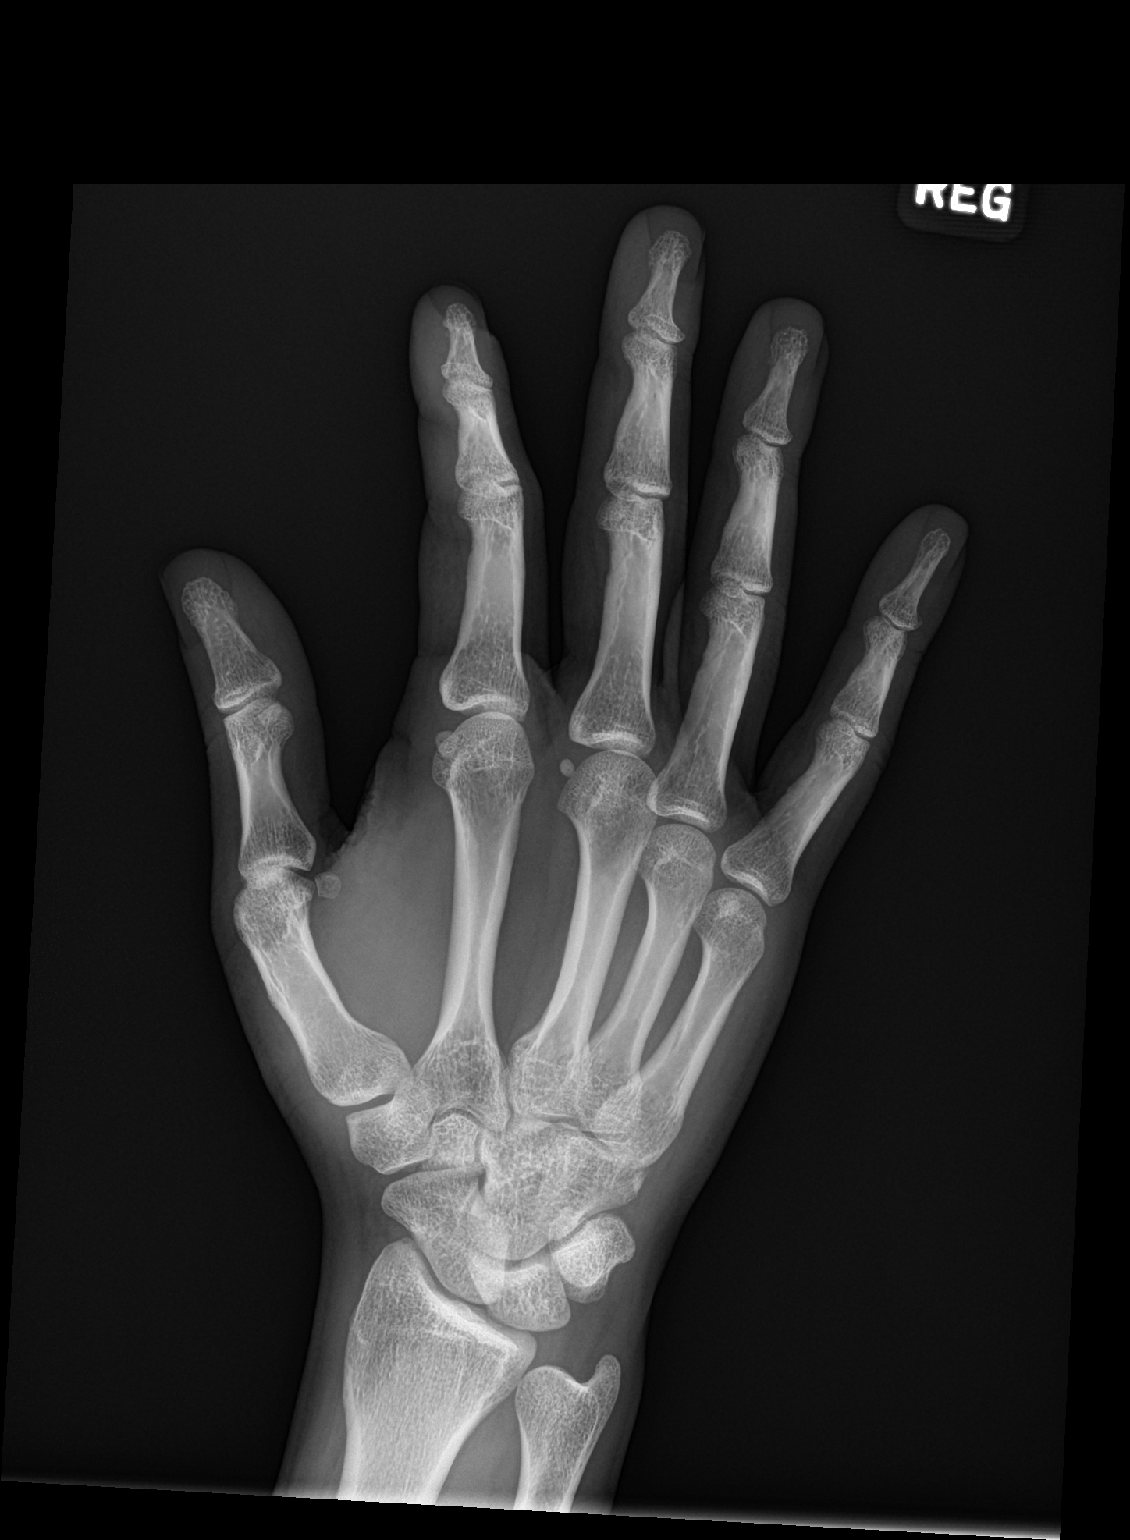

[hand lat]
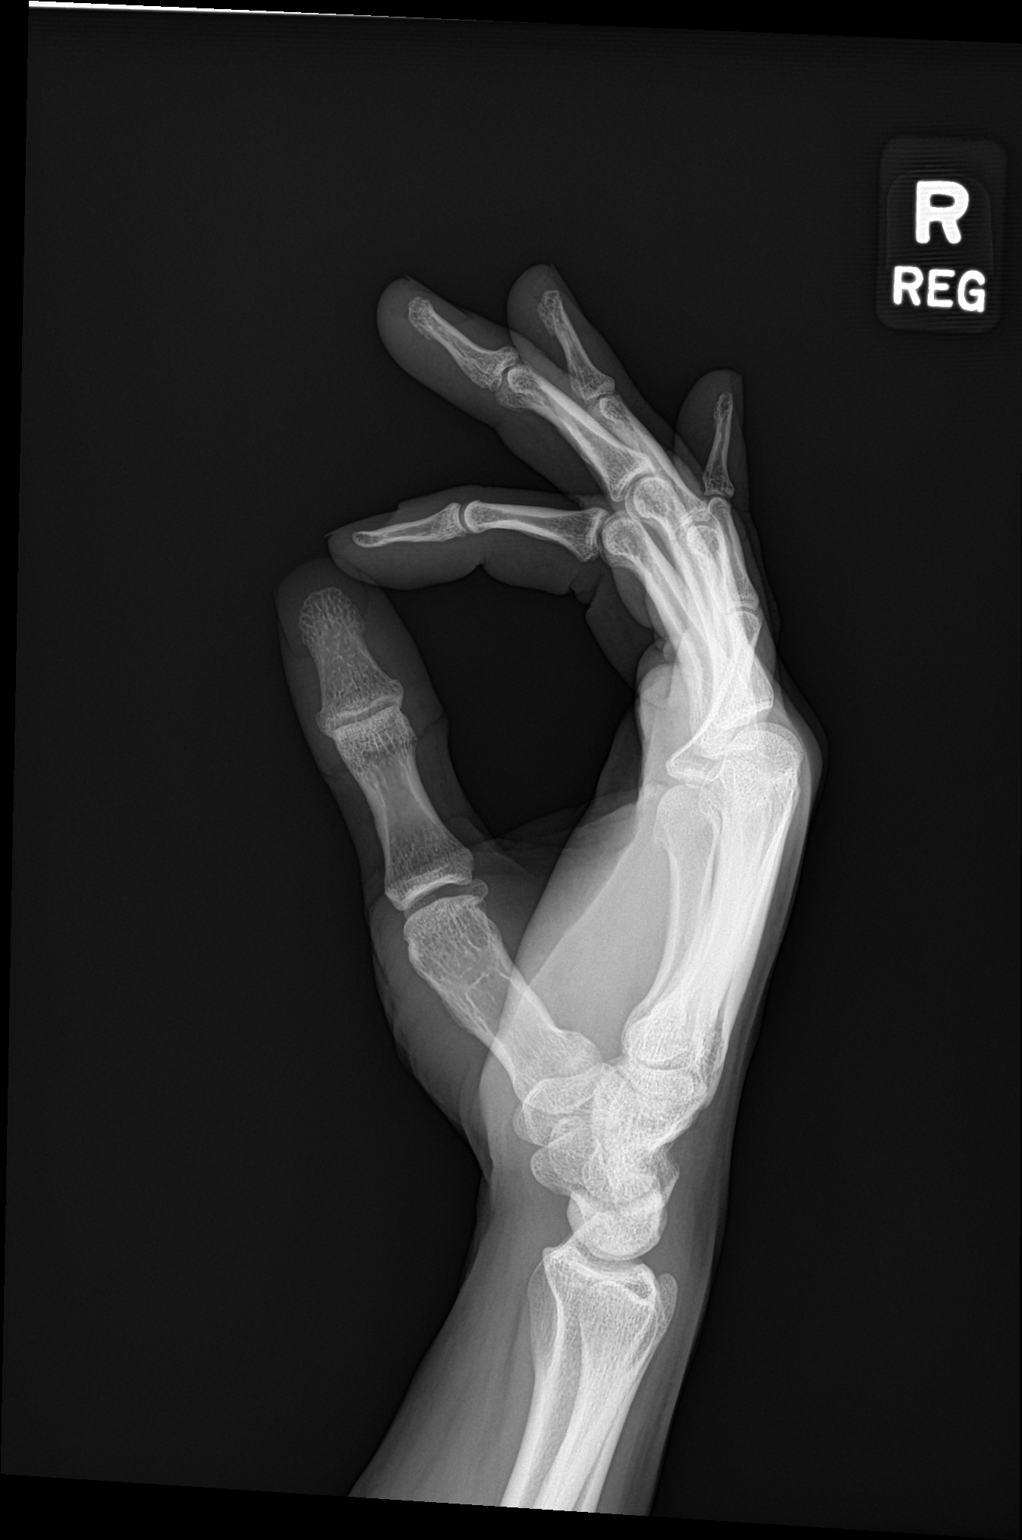

[3 of 3 positions shown; findings below may reference images not displayed]

FINDINGS: There is no evidence of fracture or dislocation. There is no
evidence of arthropathy or other focal bone abnormality. Soft
tissues are unremarkable.
IMPRESSION: Negative.

## 2020-12-02 ENCOUNTER — Emergency Department (HOSPITAL_BASED_OUTPATIENT_CLINIC_OR_DEPARTMENT_OTHER)
Admission: EM | Admit: 2020-12-02 | Discharge: 2020-12-02 | Disposition: A | Discharge status: Home / Self Care | Payer: 59 | Attending: Emergency Medicine | Admitting: Emergency Medicine

## 2020-12-02 ENCOUNTER — Other Ambulatory Visit: Payer: Self-pay

## 2020-12-02 ENCOUNTER — Encounter (HOSPITAL_BASED_OUTPATIENT_CLINIC_OR_DEPARTMENT_OTHER): Payer: Self-pay

## 2020-12-02 DIAGNOSIS — R0602 Shortness of breath: Secondary | ICD-10-CM | POA: Diagnosis present

## 2020-12-02 DIAGNOSIS — Z87891 Personal history of nicotine dependence: Secondary | ICD-10-CM | POA: Insufficient documentation

## 2020-12-02 DIAGNOSIS — I10 Essential (primary) hypertension: Secondary | ICD-10-CM | POA: Insufficient documentation

## 2020-12-02 LAB — COMPREHENSIVE METABOLIC PANEL
ALT: 19 U/L (ref 0–44)
AST: 13 U/L — ABNORMAL LOW (ref 15–41)
Albumin: 4.9 g/dL (ref 3.5–5.0)
Alkaline Phosphatase: 51 U/L (ref 38–126)
Anion gap: 15 (ref 5–15)
BUN: 11 mg/dL (ref 6–20)
CO2: 20 mmol/L — ABNORMAL LOW (ref 22–32)
Calcium: 9.7 mg/dL (ref 8.9–10.3)
Chloride: 102 mmol/L (ref 98–111)
Creatinine, Ser: 0.75 mg/dL (ref 0.61–1.24)
GFR, Estimated: >60 mL/min (ref >60)
Glucose, Bld: 94 mg/dL (ref 70–99)
Potassium: 3.3 mmol/L — ABNORMAL LOW (ref 3.5–5.1)
Sodium: 137 mmol/L (ref 135–145)
Total Bilirubin: 0.8 mg/dL (ref 0.3–1.2)
Total Protein: 7.7 g/dL (ref 6.5–8.1)

## 2020-12-02 LAB — CBC
HCT: 41.9 % (ref 39.0–52.0)
Hemoglobin: 14.5 g/dL (ref 13.0–17.0)
MCH: 28.4 pg (ref 26.0–34.0)
MCHC: 34.6 g/dL (ref 30.0–36.0)
MCV: 82 fL (ref 80.0–100.0)
Platelets: 278 10*3/uL (ref 150–400)
RBC: 5.11 MIL/uL (ref 4.22–5.81)
RDW: 12.6 % (ref 11.5–15.5)
WBC: 9.3 10*3/uL (ref 4.0–10.5)
nRBC: 0 % (ref 0.0–0.2)

## 2020-12-02 LAB — TROPONIN I (HIGH SENSITIVITY)
Troponin I (High Sensitivity): 6 ng/L (ref <18)
Troponin I (High Sensitivity): 7 ng/L (ref <18)

## 2020-12-02 NOTE — Discharge Instructions (Signed)
You have been evaluated for elevated blood pressure.  You have not been diagnosed with having high blood pressure.  Please call and follow-up closely with your primary care provider, link below for outpatient evaluation.  Continue to monitor your diet, decrease salt intake and continue with diet and exercise.  Return if you have any concern.

## 2020-12-02 NOTE — ED Triage Notes (Signed)
Pt reports pressure on his head that has been going on and off a month -  states that  his BP  was high.  Also endorses SOB.    Not known HTN. [place].

## 2020-12-02 NOTE — ED Provider Notes (Signed)
[facility] EMERGENCY DEPT Provider Note   CSN: [id number] Arrival date & time: 12/02/20  1850     History Chief Complaint  Patient presents with   Hypertension   Shortness of Breath    Alexander Adams is a 30 y.o. male.  The history is provided by the patient. No language interpreter was used.  Hypertension Associated symptoms include shortness of breath.  Shortness of Breath  30 year old male significant history of GERD presenting complaining of elevated blood pressure.  Patient states for the past 2 weeks he does not feel well.  He endorsed having some occasional pressure in his chest with some pressure sensation towards his head which concerns him, occasionally report having some heart palpitation.  Endorses bouts of nausea and feeling bit anxious.  For the past few days he has been checking his blood pressure at [facility] and he noticed it has been around 130-150 systolic which concerns him.  He decided to change his diet yesterday by eating less red meat and trying to eliminate salt intake.  He did endorse that his father died from diabetes complication at the age of 79 which concerns him.  He did admits to having bouts of anxiety in the past which seems to alleviate when he takes CBD gummy, last consumption was earlier today.  Denies alcohol or tobacco abuse.  No shortness of breath nausea vomiting headache focal numbness focal weakness active chest pain.    Past Medical History:  Diagnosis Date   GERD (gastroesophageal reflux disease)    Reflux     Patient Active Problem List   Diagnosis Date Noted   Foot pain 01/12/2013    History reviewed. No pertinent surgical history.     Family History  Problem Relation Age of Onset   Diabetes Father    Hypertension Father     Social History   Tobacco Use   Smoking status: Former    Types: Cigarettes    Quit date: 08/17/2009    Years since quitting: 11.3   Smokeless tobacco: Never  Vaping Use   Vaping Use:  Never used  Substance Use Topics   Alcohol use: No   Drug use: No    Home Medications Prior to Admission medications   Medication Sig Start Date End Date Taking? Authorizing Provider  acetaminophen (TYLENOL) 500 MG tablet Take 1 tablet (500 mg total) by mouth every 6 (six) hours as needed. 02/20/18   Law, Waylan Boga, PA-C  ibuprofen (ADVIL,MOTRIN) 600 MG tablet Take 1 tablet (600 mg total) by mouth every 6 (six) hours as needed. 02/20/18   Law, Waylan Boga, PA-C  Omeprazole (PRILOSEC PO) Take by mouth.    [provider]  ondansetron (ZOFRAN ODT) 4 MG disintegrating tablet 4mg  ODT q4 hours prn nausea/vomit 10/16/16   10/18/16, MD  ondansetron (ZOFRAN ODT) 4 MG disintegrating tablet Take 1 tablet (4 mg total) by mouth every 8 (eight) hours as needed for nausea or vomiting. 02/15/19   02/17/19, MD    Allergies    Patient has no known allergies.  Review of Systems   Review of Systems  Respiratory:  Positive for shortness of breath.   All other systems reviewed and are negative.  Physical Exam Updated Vital Signs BP (!) 162/132 (BP Location: Right Arm)   Pulse 74   Temp 98.2 F (36.8 C)   Resp 18   Ht 5\' 8"  (1.727 m)   Wt 80.3 kg   SpO2 98%   BMI 26.91 kg/m  Physical Exam Vitals and nursing note reviewed.  Constitutional:      General: He is not in acute distress.    Appearance: He is well-developed.  HENT:     Head: Atraumatic.  Eyes:     Conjunctiva/sclera: Conjunctivae normal.  Cardiovascular:     Rate and Rhythm: Normal rate and regular rhythm.  Pulmonary:     Effort: Pulmonary effort is normal.     Breath sounds: Normal breath sounds.  Abdominal:     Palpations: Abdomen is soft.     Tenderness: There is no abdominal tenderness.  Musculoskeletal:     Cervical back: Neck supple.  Skin:    Findings: No rash.  Neurological:     Mental Status: He is alert and oriented to person, place, and time.     GCS: GCS eye subscore is 4. GCS verbal  subscore is 5. GCS motor subscore is 6.     Motor: Motor function is intact.    ED Results / Procedures / Treatments   Labs (all labs ordered are listed, but only abnormal results are displayed) Labs Reviewed  COMPREHENSIVE METABOLIC PANEL - Abnormal; Notable for the following components:      Result Value   Potassium 3.3 (*)    CO2 20 (*)    AST 13 (*)    All other components within normal limits  CBC  TROPONIN I (HIGH SENSITIVITY)  TROPONIN I (HIGH SENSITIVITY)    EKG None  Date: 12/02/2020  Rate: 83  Rhythm: normal sinus rhythm  QRS Axis: normal  Intervals: normal  ST/T Wave abnormalities: normal  Conduction Disutrbances: none  Narrative Interpretation:   Old EKG Reviewed: No significant changes noted EKG reviewed and interpreted by me.   Radiology No results found.  Procedures Procedures   Medications Ordered in ED Medications - No data to display  ED Course  I have reviewed the triage vital signs and the nursing notes.  Pertinent labs & imaging results that were available during my care of the patient were reviewed by me and considered in my medical decision making (see chart for details).    MDM Rules/Calculators/A&P                           BP (!) 148/88   Pulse 78   Temp 98.2 F (36.8 C)   Resp (!) 21   Ht 5\' 8"  (1.727 m)   Wt 80.3 kg   SpO2 100%   BMI 26.91 kg/m   Final Clinical Impression(s) / ED Diagnoses Final diagnoses:  Hypertension, unspecified type    Rx / DC Orders ED Discharge Orders     None      Patient here with concerns of elevated blood pressure.  He is overall well-appearing without any finding to suggest hypertensive emergency.  No concerning features noted on today's exam, labs are reassuring, EKG without concerning ischemic changes.  Encourage patient to monitor his diet, follow-up with PCP for outpatient recheck.  Do not think blood pressure medications indicated at this time.  Return precaution given   , Fayrene Helper 12/02/20 2151    2152, MD 12/03/20 1601

## 2020-12-04 ENCOUNTER — Emergency Department (HOSPITAL_BASED_OUTPATIENT_CLINIC_OR_DEPARTMENT_OTHER): Payer: 59

## 2020-12-04 ENCOUNTER — Encounter (HOSPITAL_BASED_OUTPATIENT_CLINIC_OR_DEPARTMENT_OTHER): Payer: Self-pay | Admitting: *Deleted

## 2020-12-04 ENCOUNTER — Emergency Department (HOSPITAL_BASED_OUTPATIENT_CLINIC_OR_DEPARTMENT_OTHER)
Admission: EM | Admit: 2020-12-04 | Discharge: 2020-12-04 | Disposition: A | Discharge status: Home / Self Care | Payer: 59 | Attending: Emergency Medicine | Admitting: Emergency Medicine

## 2020-12-04 ENCOUNTER — Other Ambulatory Visit: Payer: Self-pay

## 2020-12-04 DIAGNOSIS — M542 Cervicalgia: Secondary | ICD-10-CM | POA: Diagnosis not present

## 2020-12-04 DIAGNOSIS — R251 Tremor, unspecified: Secondary | ICD-10-CM | POA: Diagnosis not present

## 2020-12-04 DIAGNOSIS — F419 Anxiety disorder, unspecified: Secondary | ICD-10-CM | POA: Diagnosis not present

## 2020-12-04 DIAGNOSIS — I10 Essential (primary) hypertension: Secondary | ICD-10-CM | POA: Insufficient documentation

## 2020-12-04 DIAGNOSIS — Z87891 Personal history of nicotine dependence: Secondary | ICD-10-CM | POA: Diagnosis not present

## 2020-12-04 LAB — BASIC METABOLIC PANEL
Anion gap: 12 (ref 5–15)
BUN: 8 mg/dL (ref 6–20)
CO2: 20 mmol/L — ABNORMAL LOW (ref 22–32)
Calcium: 9.4 mg/dL (ref 8.9–10.3)
Chloride: 101 mmol/L (ref 98–111)
Creatinine, Ser: 0.9 mg/dL (ref 0.61–1.24)
GFR, Estimated: >60 mL/min (ref >60)
Glucose, Bld: 95 mg/dL (ref 70–99)
Potassium: 3.2 mmol/L — ABNORMAL LOW (ref 3.5–5.1)
Sodium: 133 mmol/L — ABNORMAL LOW (ref 135–145)

## 2020-12-04 MED ORDER — LORAZEPAM 1 MG PO TABS
1.0000 mg | ORAL_TABLET | Freq: Once | ORAL | Status: AC
  Administered 2020-12-04: 1 mg via ORAL
  Filled 2020-12-04: qty 1

## 2020-12-04 MED ORDER — IOHEXOL 350 MG/ML SOLN
100.0000 mL | Freq: Once | INTRAVENOUS | Status: AC | PRN
  Administered 2020-12-04: 100 mL via INTRAVENOUS

## 2020-12-04 MED ORDER — HYDROXYZINE HCL 25 MG PO TABS: 25.0000 mg | ORAL_TABLET | Freq: Four times a day (QID) | ORAL | 0 refills | Status: DC | PRN

## 2020-12-04 NOTE — ED Notes (Signed)
Pt NAD, a/ox4. Pt verbalizes understanding of all DC and f/u instructions. All questions answered. Pt walks with steady gait to lobby at DC with significant other at side.

## 2020-12-04 NOTE — Discharge Instructions (Addendum)
As we discussed I would recommend that you look up the term sleep hygiene.  This encompasses a set of behaviors and changes that can help you get a better sleep at night. As we discussed I suspect your blood pressure is elevated because you were anxious. I would recommend following up with your primary care doctor in the next few weeks or month to get it rechecked.  If you do feel the need to check your blood pressure I would recommend that you get someone else to record the number and you are not aware of it to help with the anxiety. If at any point you feel unsafe, have thoughts of harming yourself or anyone else or having hallucinations please seek additional medical care and evaluation.  Additionally would recommend that you not get additional chiropractic adjustments.  Chiropractic adjustments of your head and neck on the risk of damaging the arteries in your head and your neck.  Today you received medications that may make you sleepy or impair your ability to make decisions.  For the next 24 hours please do not drive, operate heavy machinery, care for a small child with out another adult present, or perform any activities that may cause harm to you or someone else if you were to fall asleep or be impaired.   You are being prescribed a medication which may make you sleepy. Please follow up of listed precautions for at least 24 hours after taking one dose.

## 2020-12-04 NOTE — ED Triage Notes (Signed)
Pt reports he began checking his BP last week and it had been "high". States it was 161/85 just pta arrival today. States he had full work-up at [facility] yesterday and was told he was "fine". States he continues to have pressure in his head and feels anxious

## 2020-12-04 NOTE — ED Notes (Signed)
Pt appears anxious.

## 2020-12-04 NOTE — ED Provider Notes (Signed)
[facility] EM[facility] DEPARTMENT Provider Note   CSN: [id number] Arrival date & time: 12/04/20  1508     History Chief Complaint  Patient presents with   Hypertension    Alexander Adams is a 30 y.o. male with past medical history of GERD, reflux, who presents today for evaluation of headache and anxiety. He states that he has had "whooshing" feelings in his head along with headache and pressure and anxiety.  This started a few weeks ago after he was having pain in his head and neck and got chiropractic adjustments/manipulations.    The denies any weakness or numbness.  He denies any visual changes.  He does not have a history of anxiety.   He denies any abdominal pain or chest pain.  The pain is on both sides of his head.  He doesn't have a history of hypertension but reports that he has been checking his blood pressure at [facility] and it has been elevated. He [facility] where he had 2 negative troponins and reassuring CBC and CMP.    HPI     Past Medical History:  Diagnosis Date   GERD (gastroesophageal reflux disease)    Reflux     Patient Active Problem List   Diagnosis Date Noted   Foot pain 01/12/2013    History reviewed. No pertinent surgical history.     Family History  Problem Relation Age of Onset   Diabetes Father    Hypertension Father     Social History   Tobacco Use   Smoking status: Former    Types: Cigarettes    Quit date: 08/17/2009    Years since quitting: 11.3   Smokeless tobacco: Never  Vaping Use   Vaping Use: Never used  Substance Use Topics   Alcohol use: No   Drug use: Not Currently    Types: Marijuana    Comment: Using CBD currently    Home Medications Prior to Admission medications   Medication Sig Start Date End Date Taking? Authorizing Provider  hydrOXYzine (ATARAX/VISTARIL) 25 MG tablet Take 1 tablet (25 mg total) by mouth every 6 (six) hours as needed for anxiety. 12/04/20  Yes Cristina Gong, PA-C  acetaminophen (TYLENOL) 500 MG tablet Take 1 tablet (500 mg total) by mouth every 6 (six) hours as needed. 02/20/18   Law, Waylan Boga, PA-C  ibuprofen (ADVIL,MOTRIN) 600 MG tablet Take 1 tablet (600 mg total) by mouth every 6 (six) hours as needed. 02/20/18   Law, Waylan Boga, PA-C  Omeprazole (PRILOSEC PO) Take by mouth.    [provider]  ondansetron (ZOFRAN ODT) 4 MG disintegrating tablet 4mg  ODT q4 hours prn nausea/vomit 10/16/16   10/18/16, MD  ondansetron (ZOFRAN ODT) 4 MG disintegrating tablet Take 1 tablet (4 mg total) by mouth every 8 (eight) hours as needed for nausea or vomiting. 02/15/19   02/17/19, MD    Allergies    Patient has no known allergies.  Review of Systems   Review of Systems  Constitutional:  Negative for chills and fever.  HENT:  Negative for congestion.   Respiratory:  Negative for cough, chest tightness and shortness of breath.   Cardiovascular:  Negative for chest pain.  Gastrointestinal:  Negative for abdominal pain.  Musculoskeletal:  Positive for neck pain.  Skin:  Negative for color change.  Neurological:  Positive for headaches. Negative for dizziness, seizures, speech difficulty, weakness and numbness.  Psychiatric/Behavioral:  The patient  is nervous/anxious.   All other systems reviewed and are negative.  Physical Exam Updated Vital Signs BP (!) 141/80 (BP Location: Left Arm)   Pulse 80   Temp 98 F (36.7 C) (Oral)   Resp 18   Ht 5\' 8"  (1.727 m)   Wt 81.6 kg   SpO2 100%   BMI 27.37 kg/m   Physical Exam Vitals and nursing note reviewed.  Constitutional:      General: He is not in acute distress.    Appearance: He is not ill-appearing.  HENT:     Head: Atraumatic.     Mouth/Throat:     Mouth: Mucous membranes are moist.  Eyes:     Conjunctiva/sclera: Conjunctivae normal.  Cardiovascular:     Rate and Rhythm: Normal rate.  Pulmonary:     Effort: Pulmonary effort is normal. No respiratory  distress.  Abdominal:     General: There is no distension.     Tenderness: There is no abdominal tenderness.  Musculoskeletal:     Cervical back: Normal range of motion and neck supple.     Comments: No obvious acute injury  Skin:    General: Skin is warm.  Neurological:     Mental Status: He is alert.     Comments: Awake and alert, answers all questions appropriately.  Speech is not slurred. Facial movements are symmetric.  Speech is not slurred.  Full EOMs without nystagmus bilaterally.  Slight tremor on finger-nose-finger bilaterally.  Intact heel toe shin bilaterally.  He is able to heel walk and toe walk.  5/5 strength bilateral arms and legs.  Sensation intact to light touch to bilateral arms and legs and through the face bilaterally.  Psychiatric:        Mood and Affect: Mood normal.        Behavior: Behavior normal.    ED Results / Procedures / Treatments   Labs (all labs ordered are listed, but only abnormal results are displayed) Labs Reviewed  BASIC METABOLIC PANEL - Abnormal; Notable for the following components:      Result Value   Sodium 133 (*)    Potassium 3.2 (*)    CO2 20 (*)    All other components within normal limits    EKG None  Radiology CT ANGIO HEAD NECK W WO CM W PERF  Result Date: 12/04/2020 CLINICAL DATA:  Neuro deficit, acute, stroke suspected; chiropractic adjustments with neck pain and development of pressure/fullness. Tremor with finger-nose-finger. EXAM: CT ANGIOGRAPHY HEAD AND NECK TECHNIQUE: Multidetector CT imaging of the head and neck was performed using the standard protocol during bolus administration of intravenous contrast. Multiplanar CT image reconstructions and MIPs were obtained to evaluate the vascular anatomy. Carotid stenosis measurements (when applicable) are obtained utilizing NASCET criteria, using the distal internal carotid diameter as the denominator. CONTRAST:  02/03/2021 OMNIPAQUE IOHEXOL 350 MG/ML SOLN COMPARISON:  No pertinent  prior exams available for comparison. FINDINGS: CT HEAD FINDINGS Brain: Cerebral volume is normal. There is no acute intracranial hemorrhage. No demarcated cortical infarct. No extra-axial fluid collection. No evidence of an intracranial mass. No midline shift. Vascular: No hyperdense vessel. Skull: Normal. Negative for fracture or focal lesion. Sinuses: Small left maxillary sinus mucous retention cysts. Orbits: No orbital mass or acute orbital finding. Review of the MIP images confirms the above findings CTA NECK FINDINGS There is suboptimal arterial contrast enhancement within the neck, limiting evaluation. Aortic arch: Standard aortic branching. The visualized aortic arch is normal in caliber. No hemodynamically significant  innominate or proximal subclavian artery stenosis. Right carotid system: CCA and ICA patent within the neck without stenosis. No appreciable dissection. Left carotid system: CCA and ICA patent within the neck without stenosis. No appreciable dissection. Vertebral arteries: Poor arterial contrast enhancement and the presence of venous enhancement precludes adequate evaluation of the vertebral arteries within the neck. Skeleton: No acute bony abnormality or aggressive osseous lesion. Other neck: No neck mass or cervical lymphadenopathy. Upper chest: No consolidation within the imaged lung apices. Review of the MIP images confirms the above findings CTA HEAD FINDINGS Suboptimal intracranial arterial contrast enhancement, limiting evaluation. Anterior circulation: The intracranial internal carotid arteries are patent. The M1 middle cerebral arteries are patent. Poor arterial contrast enhancement precludes adequate evaluation of the M2 and more distal middle cerebral artery vessels. The anterior cerebral arteries are patent proximally without proximal occlusion. Suboptimal arterial contrast enhancement limits evaluation of the distal anterior cerebral arteries. Within described limitations, no  intracranial aneurysm is identified. Posterior circulation: The intracranial vertebral arteries are patent. The basilar artery is patent. The posterior cerebral arteries are patent proximally without proximal occlusion. Suboptimal arterial contrast enhancement limits evaluation the posterior cerebral arteries distally. Posterior communicating arteries are diminutive or absent bilaterally. Venous sinuses: Within the limitations of contrast timing, no convincing thrombus. Anatomic variants: As described. Review of the MIP images confirms the above findings IMPRESSION: CT head: 1. No evidence of acute intracranial abnormality. 2. Small left maxillary sinus mucous retention cysts. CTA neck: 1. Suboptimal arterial contrast enhancement limits evaluation. 2. The common carotid and internal carotid arteries are patent within the neck without stenosis. Within described limitations, there is no appreciable dissection within these vessels. 3. Poor arterial contrast enhancement and the presence of venous enhancement precludes adequate evaluation of the vertebral arteries within the neck. Consider a repeat examination or MR angiography of the neck for further evaluation. MRA head: 1. Overall limited evaluation due to suboptimal intracranial arterial contrast enhancement. 2. No appreciable intracranial proximal large vessel occlusion. 3. Please note suboptimal arterial enhancement precludes adequate evaluation for vessel occlusion within the M2 and more distal middle cerebral artery vessels, within the distal anterior cerebral arteries and within the distal posterior cerebral arteries. Electronically Signed   By: Jackey Loge D.O.   On: 12/04/2020 19:35    Procedures Procedures   Medications Ordered in ED Medications  LORazepam (ATIVAN) tablet 1 mg (1 mg Oral Given 12/04/20 1825)  iohexol (OMNIPAQUE) 350 MG/ML injection 100 mL (100 mLs Intravenous Contrast Given 12/04/20 1845)    ED Course  I have reviewed the triage  vital signs and the nursing notes.  Pertinent labs & imaging results that were available during my care of the patient were reviewed by me and considered in my medical decision making (see chart for details).    MDM Rules/Calculators/A&P                          Patient is a 31 year old man who presents today for evaluation of anxiety, abnormal feelings in his head after chiropractic adjustment/manipulations and hypertension. On my exam he appears neurovascularly intact however he does have a slight tremor with finger-nose-finger bilaterally.  He recently had labs and chest pain work-up without abnormalities found. Given that he has had chiropractic adjustments/manipulations CT head and neck are obtained without hemorrhage, dissection, or other obvious abnormalities. Given that his only questionable neurodeficit was slight tremor with finger-nose-finger, and this could also be explained by his anxiety when he  is otherwise neurologically intact and improved on repeat exam I do not think that he requires additional imaging.  Discussed results with patient and family at bedside, including the limitations of the study. Recommended conservative care.  His symptoms fully went away after he was given Ativan in the emergency room. Will give prescription for Atarax as needed. Recommended outpatient counseling/therapy.  Given resources. I suspect his blood pressure has been minimally elevated given his anxiety. He does not appear to have any evidence of significant complications from his borderline hypertension or evidence of hypertensive urgency/emergency.  Return precautions were discussed with patient who states their understanding.  At the time of discharge patient denied any unaddressed complaints or concerns.  Patient is agreeable for discharge home.  Note: Portions of this report may have been transcribed using voice recognition software. Every effort was made to ensure accuracy; however,  inadvertent computerized transcription errors may be present   Final Clinical Impression(s) / ED Diagnoses Final diagnoses:  Anxiety    Rx / DC Orders ED Discharge Orders          Ordered    hydrOXYzine (ATARAX/VISTARIL) 25 MG tablet  Every 6 hours PRN        12/04/20 2027             Cristina Gong, PA-C 12/05/20 2357    Charlynne Pander, MD 12/06/20 1459

## 2020-12-09 ENCOUNTER — Ambulatory Visit: Payer: Self-pay | Admitting: Nurse Practitioner

## 2021-01-04 ENCOUNTER — Encounter (HOSPITAL_COMMUNITY): Payer: Self-pay | Admitting: Radiology

## 2021-01-25 ENCOUNTER — Encounter: Payer: Self-pay | Admitting: Emergency Medicine

## 2021-01-25 ENCOUNTER — Ambulatory Visit (INDEPENDENT_AMBULATORY_CARE_PROVIDER_SITE_OTHER): Payer: 59 | Admitting: Emergency Medicine

## 2021-01-25 ENCOUNTER — Other Ambulatory Visit: Payer: Self-pay

## 2021-01-25 VITALS — BP 124/70 | HR 69 | Ht 68.0 in | Wt 189.0 lb

## 2021-01-25 DIAGNOSIS — Z1329 Encounter for screening for other suspected endocrine disorder: Secondary | ICD-10-CM

## 2021-01-25 DIAGNOSIS — Z13220 Encounter for screening for lipoid disorders: Secondary | ICD-10-CM | POA: Diagnosis not present

## 2021-01-25 DIAGNOSIS — Z114 Encounter for screening for human immunodeficiency virus [HIV]: Secondary | ICD-10-CM | POA: Diagnosis not present

## 2021-01-25 DIAGNOSIS — Z1159 Encounter for screening for other viral diseases: Secondary | ICD-10-CM | POA: Diagnosis not present

## 2021-01-25 DIAGNOSIS — Z13228 Encounter for screening for other metabolic disorders: Secondary | ICD-10-CM | POA: Diagnosis not present

## 2021-01-25 DIAGNOSIS — Z130 Encounter for screening for diseases of the blood and blood-forming organs and certain disorders involving the immune mechanism: Secondary | ICD-10-CM | POA: Diagnosis not present

## 2021-01-25 DIAGNOSIS — Z0000 Encounter for general adult medical examination without abnormal findings: Secondary | ICD-10-CM

## 2021-01-25 LAB — COMPREHENSIVE METABOLIC PANEL
ALT: 21 U/L (ref 0–53)
AST: 13 U/L (ref 0–37)
Albumin: 4.5 g/dL (ref 3.5–5.2)
Alkaline Phosphatase: 49 U/L (ref 39–117)
BUN: 12 mg/dL (ref 6–23)
CO2: 27 mEq/L (ref 19–32)
Calcium: 9.6 mg/dL (ref 8.4–10.5)
Chloride: 105 mEq/L (ref 96–112)
Creatinine, Ser: 0.81 mg/dL (ref 0.40–1.50)
GFR: 117.14 mL/min (ref >60.00)
Glucose, Bld: 88 mg/dL (ref 70–99)
Potassium: 3.9 mEq/L (ref 3.5–5.1)
Sodium: 139 mEq/L (ref 135–145)
Total Bilirubin: 0.7 mg/dL (ref 0.2–1.2)
Total Protein: 7 g/dL (ref 6.0–8.3)

## 2021-01-25 LAB — LIPID PANEL
Cholesterol: 176 mg/dL (ref 0–200)
HDL: 44.1 mg/dL (ref >39.00)
LDL Cholesterol: 119 mg/dL — ABNORMAL HIGH (ref 0–99)
NonHDL: 131.75
Total CHOL/HDL Ratio: 4
Triglycerides: 66 mg/dL (ref 0.0–149.0)
VLDL: 13.2 mg/dL (ref 0.0–40.0)

## 2021-01-25 LAB — HEMOGLOBIN A1C: Hgb A1c MFr Bld: 5.2 % (ref 4.6–6.5)

## 2021-01-25 NOTE — Patient Instructions (Signed)

## 2021-01-25 NOTE — Progress Notes (Signed)
Alexander Adams 30 y.o.   Chief Complaint  Patient presents with   New Patient (Initial Visit)    Pt would like physical    HISTORY OF PRESENT ILLNESS: This is a 30 y.o. male first visit to this office.  Requesting physical. Healthy male with a healthy lifestyle. No longer smoking. No chronic medical problems. Occasional bouts of situational anxiety. No other complaints or medical concerns today. Recently seen in the emergency department concerned about elevated blood pressure.  Told it was related to anxiety. Negative work-up.  HPI   Prior to Admission medications   Medication Sig Start Date End Date Taking? Authorizing Provider  acetaminophen (TYLENOL) 500 MG tablet Take 1 tablet (500 mg total) by mouth every 6 (six) hours as needed. Patient not taking: Reported on 01/25/2021 02/20/18   Emi Holes, PA-C  hydrOXYzine (ATARAX/VISTARIL) 25 MG tablet Take 1 tablet (25 mg total) by mouth every 6 (six) hours as needed for anxiety. Patient not taking: Reported on 01/25/2021 12/04/20   Cristina Gong, PA-C  ibuprofen (ADVIL,MOTRIN) 600 MG tablet Take 1 tablet (600 mg total) by mouth every 6 (six) hours as needed. Patient not taking: Reported on 01/25/2021 02/20/18   Emi Holes, PA-C  Omeprazole (PRILOSEC PO) Take by mouth. Patient not taking: Reported on 01/25/2021    [provider]  ondansetron (ZOFRAN ODT) 4 MG disintegrating tablet 4mg  ODT q4 hours prn nausea/vomit Patient not taking: Reported on 01/25/2021 10/16/16   10/18/16, MD  ondansetron (ZOFRAN ODT) 4 MG disintegrating tablet Take 1 tablet (4 mg total) by mouth every 8 (eight) hours as needed for nausea or vomiting. Patient not taking: Reported on 01/25/2021 02/15/19   02/17/19, MD    No Known Allergies  Patient Active Problem List   Diagnosis Date Noted   Foot pain 01/12/2013    Past Medical History:  Diagnosis Date   GERD (gastroesophageal reflux disease)    Reflux     No  past surgical history on file.  Social History   Socioeconomic History   Marital status: Single    Spouse name: Not on file   Number of children: Not on file   Years of education: Not on file   Highest education level: Not on file  Occupational History   Not on file  Tobacco Use   Smoking status: Former    Types: Cigarettes    Quit date: 08/17/2009    Years since quitting: 11.4   Smokeless tobacco: Never  Vaping Use   Vaping Use: Never used  Substance and Sexual Activity   Alcohol use: No   Drug use: Not Currently    Types: Marijuana    Comment: Using CBD currently   Sexual activity: Not on file  Other Topics Concern   Not on file  Social History Narrative   Not on file   Social Determinants of Health   Financial Resource Strain: Not on file  Food Insecurity: Not on file  Transportation Needs: Not on file  Physical Activity: Not on file  Stress: Not on file  Social Connections: Not on file  Intimate Partner Violence: Not on file    Family History  Problem Relation Age of Onset   Diabetes Father    Hypertension Father      Review of Systems  Constitutional: Negative.  Negative for chills and fever.  HENT: Negative.  Negative for congestion and sore throat.   Eyes: Negative.   Respiratory: Negative.  Negative for cough  and shortness of breath.   Cardiovascular: Negative.  Negative for chest pain and palpitations.  Gastrointestinal: Negative.  Negative for abdominal pain, diarrhea, nausea and vomiting.  Genitourinary: Negative.   Skin: Negative.   Neurological: Negative.  Negative for dizziness and headaches.  Psychiatric/Behavioral:  The patient is nervous/anxious (Occasional bouts of situational anxiety).   All other systems reviewed and are negative.  Today's Vitals   01/25/21 0936  BP: 124/70  Pulse: 69  SpO2: 97%  Weight: 189 lb (85.7 kg)  Height: 5\' 8"  (1.727 m)   Body mass index is 28.74 kg/m.  Physical Exam Vitals reviewed.   Constitutional:      Appearance: Normal appearance.  HENT:     Head: Normocephalic.     Right Ear: Tympanic membrane, ear canal and external ear normal.     Left Ear: Tympanic membrane, ear canal and external ear normal.     Mouth/Throat:     Mouth: Mucous membranes are moist.     Pharynx: Oropharynx is clear.  Eyes:     Extraocular Movements: Extraocular movements intact.     Conjunctiva/sclera: Conjunctivae normal.     Pupils: Pupils are equal, round, and reactive to light.  Cardiovascular:     Rate and Rhythm: Normal rate and regular rhythm.     Pulses: Normal pulses.     Heart sounds: Normal heart sounds.  Pulmonary:     Effort: Pulmonary effort is normal.     Breath sounds: Normal breath sounds.  Abdominal:     General: Bowel sounds are normal. There is no distension.     Palpations: Abdomen is soft.     Tenderness: There is no abdominal tenderness.  Musculoskeletal:        General: Normal range of motion.     Cervical back: Normal range of motion and neck supple.     Right lower leg: No edema.     Left lower leg: No edema.  Skin:    General: Skin is warm and dry.     Capillary Refill: Capillary refill takes less than 2 seconds.  Neurological:     General: No focal deficit present.     Mental Status: He is alert and oriented to person, place, and time.  Psychiatric:        Mood and Affect: Mood normal.        Behavior: Behavior normal.     ASSESSMENT & PLAN: Problem List Items Addressed This Visit   None Visit Diagnoses     Routine general medical examination at a health care facility    -  Primary   Need for hepatitis C screening test       Relevant Orders   Hepatitis C antibody screen   Screening for HIV (human immunodeficiency virus)       Relevant Orders   HIV antibody   Screening for deficiency anemia       Screening for lipoid disorders       Relevant Orders   Lipid panel   Screening for endocrine, metabolic and immunity disorder       Relevant  Orders   Comprehensive metabolic panel   Hemoglobin A1c      Modifiable risk factors discussed with patient. Anticipatory guidance according to age provided. The following topics were also discussed: Social Determinants of Health Smoking.  No longer smoking Diet and nutrition Benefits of exercise Cancer family history review Vaccinations Cardiovascular risk assessment Mental health including depression and anxiety Fall and accident prevention  Patient Instructions  Health Maintenance, Male Adopting a healthy lifestyle and getting preventive care are important in promoting health and wellness. Ask your health care provider about: The right schedule for you to have regular tests and exams. Things you can do on your own to prevent diseases and keep yourself healthy. What should I know about diet, weight, and exercise? Eat a healthy diet  Eat a diet that includes plenty of vegetables, fruits, low-fat dairy products, and lean protein. Do not eat a lot of foods that are high in solid fats, added sugars, or sodium. Maintain a healthy weight Body mass index (BMI) is a measurement that can be used to identify possible weight problems. It estimates body fat based on height and weight. Your health care provider can help determine your BMI and help you achieve or maintain a healthy weight. Get regular exercise Get regular exercise. This is one of the most important things you can do for your health. Most adults should: Exercise for at least 150 minutes each week. The exercise should increase your heart rate and make you sweat (moderate-intensity exercise). Do strengthening exercises at least twice a week. This is in addition to the moderate-intensity exercise. Spend less time sitting. Even light physical activity can be beneficial. Watch cholesterol and blood lipids Have your blood tested for lipids and cholesterol at 32 years of age, then have this test every 5 years. You may need to have  your cholesterol levels checked more often if: Your lipid or cholesterol levels are high. You are older than 32 years of age. You are at high risk for heart disease. What should I know about cancer screening? Many types of cancers can be detected early and may often be prevented. Depending on your health history and family history, you may need to have cancer screening at various ages. This may include screening for: Colorectal cancer. Prostate cancer. Skin cancer. Lung cancer. What should I know about heart disease, diabetes, and high blood pressure? Blood pressure and heart disease High blood pressure causes heart disease and increases the risk of stroke. This is more likely to develop in people who have high blood pressure readings or are overweight. Talk with your health care provider about your target blood pressure readings. Have your blood pressure checked: Every 3-5 years if you are 9-71 years of age. Every year if you are 35 years old or older. If you are between the ages of 71 and 22 and are a current or former smoker, ask your health care provider if you should have a one-time screening for abdominal aortic aneurysm (AAA). Diabetes Have regular diabetes screenings. This checks your fasting blood sugar level. Have the screening done: Once every three years after age 51 if you are at a normal weight and have a low risk for diabetes. More often and at a younger age if you are overweight or have a high risk for diabetes. What should I know about preventing infection? Hepatitis B If you have a higher risk for hepatitis B, you should be screened for this virus. Talk with your health care provider to find out if you are at risk for hepatitis B infection. Hepatitis C Blood testing is recommended for: Everyone born from [birth year] through 1967. Anyone with known risk factors for hepatitis C. Sexually transmitted infections (STIs) You should be screened each year for STIs, including  gonorrhea and chlamydia, if: You are sexually active and are younger than 32 years of age. You are older than 32 years  of age and your health care provider tells you that you are at risk for this type of infection. Your sexual activity has changed since you were last screened, and you are at increased risk for chlamydia or gonorrhea. Ask your health care provider if you are at risk. Ask your health care provider about whether you are at high risk for HIV. Your health care provider may recommend a prescription medicine to help prevent HIV infection. If you choose to take medicine to prevent HIV, you should first get tested for HIV. You should then be tested every 3 months for as long as you are taking the medicine. Follow these instructions at home: Alcohol use Do not drink alcohol if your health care provider tells you not to drink. If you drink alcohol: Limit how much you have to 0-2 drinks a day. Know how much alcohol is in your drink. In the [place] one drink equals one 12 oz bottle of beer (355 mL), one 5 oz glass of wine (148 mL), or one 1 oz glass of hard liquor (44 mL). Lifestyle Do not use any products that contain nicotine or tobacco. These products include cigarettes, chewing tobacco, and vaping devices, such as e-cigarettes. If you need help quitting, ask your health care provider. Do not use street drugs. Do not share needles. Ask your health care provider for help if you need support or information about quitting drugs. General instructions Schedule regular health, dental, and eye exams. Stay current with your vaccines. Tell your health care provider if: You often feel depressed. You have ever been abused or do not feel safe at home. Summary Adopting a healthy lifestyle and getting preventive care are important in promoting health and wellness. Follow your health care provider's instructions about healthy diet, exercising, and getting tested or screened for diseases. Follow your  health care provider's instructions on monitoring your cholesterol and blood pressure. This information is not intended to replace advice given to you by your health care provider. Make sure you discuss any questions you have with your health care provider. Document Revised: 07/05/2020 Document Reviewed: 07/05/2020 Elsevier Patient Education  2022 Elsevier Inc.     Edwina Barth, MD [facility]

## 2021-01-26 LAB — HEPATITIS C ANTIBODY
Hepatitis C Ab: NONREACTIVE
SIGNAL TO CUT-OFF: 0.06 (ref <1.00)

## 2021-01-26 LAB — HIV ANTIBODY (ROUTINE TESTING W REFLEX): HIV 1&2 Ab, 4th Generation: NONREACTIVE

## 2021-01-27 ENCOUNTER — Encounter: Payer: Self-pay | Admitting: Emergency Medicine

## 2021-01-30 NOTE — Telephone Encounter (Signed)
Thanks

## 2021-03-03 ENCOUNTER — Ambulatory Visit: Payer: Self-pay | Admitting: Emergency Medicine

## 2021-08-05 ENCOUNTER — Emergency Department
Admission: EM | Admit: 2021-08-05 | Discharge: 2021-08-05 | Disposition: A | Discharge status: Home / Self Care | Payer: 59 | Attending: Student in an Organized Health Care Education/Training Program | Admitting: Student in an Organized Health Care Education/Training Program

## 2021-08-05 ENCOUNTER — Emergency Department: Payer: 59

## 2021-08-05 ENCOUNTER — Other Ambulatory Visit: Payer: Self-pay

## 2021-08-05 DIAGNOSIS — R0600 Dyspnea, unspecified: Secondary | ICD-10-CM | POA: Diagnosis not present

## 2021-08-05 DIAGNOSIS — F419 Anxiety disorder, unspecified: Secondary | ICD-10-CM | POA: Insufficient documentation

## 2021-08-05 DIAGNOSIS — R0602 Shortness of breath: Secondary | ICD-10-CM | POA: Diagnosis present

## 2021-08-05 LAB — BASIC METABOLIC PANEL
Anion gap: 11 (ref 5–15)
BUN: 17 mg/dL (ref 6–20)
CO2: 17 mmol/L — ABNORMAL LOW (ref 22–32)
Calcium: 9.5 mg/dL (ref 8.9–10.3)
Chloride: 107 mmol/L (ref 98–111)
Creatinine, Ser: 0.83 mg/dL (ref 0.61–1.24)
GFR, Estimated: >60 mL/min (ref >60)
Glucose, Bld: 162 mg/dL — ABNORMAL HIGH (ref 70–99)
Potassium: 2.9 mmol/L — ABNORMAL LOW (ref 3.5–5.1)
Sodium: 135 mmol/L (ref 135–145)

## 2021-08-05 LAB — CBC
HCT: 41.9 % (ref 39.0–52.0)
Hemoglobin: 14.4 g/dL (ref 13.0–17.0)
MCH: 28.5 pg (ref 26.0–34.0)
MCHC: 34.4 g/dL (ref 30.0–36.0)
MCV: 82.8 fL (ref 80.0–100.0)
Platelets: 294 10*3/uL (ref 150–400)
RBC: 5.06 MIL/uL (ref 4.22–5.81)
RDW: 13 % (ref 11.5–15.5)
WBC: 11 10*3/uL — ABNORMAL HIGH (ref 4.0–10.5)
nRBC: 0 % (ref 0.0–0.2)

## 2021-08-05 LAB — TROPONIN I (HIGH SENSITIVITY): Troponin I (High Sensitivity): 11 ng/L (ref <18)

## 2021-08-05 MED ORDER — DIAZEPAM 2 MG PO TABS
2.0000 mg | ORAL_TABLET | Freq: Three times a day (TID) | ORAL | 0 refills | Status: DC | PRN
Start: 2021-08-05 — End: 2021-10-15

## 2021-08-05 MED ORDER — DIAZEPAM 2 MG PO TABS
2.0000 mg | ORAL_TABLET | Freq: Once | ORAL | Status: AC
  Administered 2021-08-05: 2 mg via ORAL
  Filled 2021-08-05: qty 1

## 2021-08-05 NOTE — ED Provider Notes (Signed)
[facility] Provider Note    Event Date/Time   First MD Initiated Contact with Patient 08/05/21 1828     (approximate)   History   Shortness of Breath   HPI  Alexander Adams is a 31 y.o. male   with a history of reflux and anxiety not on any medications presents to the ER for evaluation of shortness of breath feeling anxious.  States he was driving with his friend riding to see a comedy show today and started feeling he was having a panic attack.  States that he has been using over-the-counter inhaler epinephrine today feels like he started getting worse after this.  The inhaler is up for him.  He denies any history of asthma.  No fevers or chills.  Feels like his symptoms have gotten worse with the poor air quality over the past few days.  He does exercise daily riding bike.  Is also made some diet changes.  Does admit to increased stress at work.      Physical Exam   Triage Vital Signs: ED Triage Vitals  Enc Vitals Group     BP 08/05/21 1739 (!) 153/85     Pulse Rate 08/05/21 1739 93     Resp 08/05/21 1739 18     Temp 08/05/21 1739 97.7 F (36.5 C)     Temp Source 08/05/21 1739 Oral     SpO2 08/05/21 1739 99 %     Weight --      Height --      Head Circumference --      Peak Flow --      Pain Score 08/05/21 1737 5     Pain Loc --      Pain Edu? --      Excl. in GC? --     Most recent vital signs: Vitals:   08/05/21 1843 08/05/21 1912  BP: (!) 145/74 (!) 144/74  Pulse: 85 82  Resp: 17 17  Temp: 98.2 F (36.8 C) (!) 96.9 F (36.1 C)  SpO2: 96% 98%     Constitutional: Alert  Eyes: Conjunctivae are normal.  Head: Atraumatic. Nose: No congestion/rhinnorhea. Mouth/Throat: Mucous membranes are moist.   Neck: Painless ROM.  Cardiovascular:   Good peripheral circulation. Respiratory: Normal respiratory effort.  No retractions.  Gastrointestinal: Soft and nontender.  Musculoskeletal:  no deformity Neurologic:  MAE spontaneously. No gross  focal neurologic deficits are appreciated.  Skin:  Skin is warm, dry and intact. No rash noted. Psychiatric: Mood and affect are anxious. Speech and behavior are normal.    ED Results / Procedures / Treatments   Labs (all labs ordered are listed, but only abnormal results are displayed) Labs Reviewed  BASIC METABOLIC PANEL - Abnormal; Notable for the following components:      Result Value   Potassium 2.9 (*)    CO2 17 (*)    Glucose, Bld 162 (*)    All other components within normal limits  CBC - Abnormal; Notable for the following components:   WBC 11.0 (*)    All other components within normal limits  TROPONIN I (HIGH SENSITIVITY)  TROPONIN I (HIGH SENSITIVITY)     EKG  ED ECG REPORT I, Willy Eddy, the attending physician, personally viewed and interpreted this ECG.   Date: 08/05/2021  EKG Time: 17:32  Rate: 100  Rhythm: sinus  Axis: normal  Intervals:normal  ST&T Change: no stemi, no depression, nonspecific st abn    RADIOLOGY Please see  ED Course for my review and interpretation.  I personally reviewed all radiographic images ordered to evaluate for the above acute complaints and reviewed radiology reports and findings.  These findings were personally discussed with the patient.  Please see medical record for radiology report.    PROCEDURES:  Critical Care performed:   Procedures   MEDICATIONS ORDERED IN ED: Medications  diazepam (VALIUM) tablet 2 mg (2 mg Oral Given 08/05/21 1843)     IMPRESSION / MDM / ASSESSMENT AND PLAN / ED COURSE  I reviewed the triage vital signs and the nursing notes.                              Differential diagnosis includes, but is not limited to, Asthma, copd, CHF, pna, ptx, malignancy, Pe, anemia  Presenting to the ER for evaluation of symptoms as described above.  Does appear anxious no acute distress no hypoxia no tachycardia.  He is protecting his airway.  EKG is nonischemic he denies any chest pain or  pressure.  I think this is more likely panic possible adverse reaction to the inhaled epinephrine.  Very low suspicion for ACS or dissection.  Will observe.  Will check blood work.  Will check chest x-ray   Clinical Course as of 08/05/21 1944  Fri Aug 05, 2021  [id number] Patient reassessed.  Feels significantly improved after Valium.  This is more likely secondary to anxiety.  His work-up here is reassuring.  EKG is nonischemic.  Troponin negative.  Chest x-ray on my interpretation without pneumothorax or infiltrates.  No acute abnormality per radiology.  Do suspect he was hyperventilating and feeling palpitations after taking over-the-counter epinephrine inhaler.  Discouraged as he has no wheezing on exam.  No findings to suggest acute bronchitis.  Not consistent with PE.  He is low risk by [place] and PERC negative.  Does appear stable and appropriate for outpatient follow-up.  Patient agreeable to plan. [PR]    Clinical Course User Index [PR] Willy Eddy, MD      FINAL CLINICAL IMPRESSION(S) / ED DIAGNOSES   Final diagnoses:  Dyspnea, unspecified type  Anxiety     Rx / DC Orders   ED Discharge Orders          Ordered    diazepam (VALIUM) 2 MG tablet  Every 8 hours PRN        08/05/21 1912             Note:  This document was prepared using Dragon voice recognition software and may include unintentional dictation errors.    Willy Eddy, MD 08/05/21 1944

## 2021-08-05 NOTE — ED Triage Notes (Signed)
Pt to EF via POV. Pt reports sudden onset of SOB, chest tightness and feeling very anxious. Pt reports hx of anxiety but doesn't take any medications.   Pt also states he bought an OTC inhaler that he took about 33mins PTA. Pt also reports taking natural supplements and recent diet changes.

## 2021-08-13 ENCOUNTER — Encounter (HOSPITAL_BASED_OUTPATIENT_CLINIC_OR_DEPARTMENT_OTHER): Payer: Self-pay | Admitting: Emergency Medicine

## 2021-08-13 ENCOUNTER — Emergency Department (HOSPITAL_BASED_OUTPATIENT_CLINIC_OR_DEPARTMENT_OTHER)
Admission: EM | Admit: 2021-08-13 | Discharge: 2021-08-13 | Disposition: A | Discharge status: Home / Self Care | Payer: 59 | Attending: Emergency Medicine | Admitting: Emergency Medicine

## 2021-08-13 ENCOUNTER — Other Ambulatory Visit: Payer: Self-pay

## 2021-08-13 DIAGNOSIS — F419 Anxiety disorder, unspecified: Secondary | ICD-10-CM | POA: Insufficient documentation

## 2021-08-13 MED ORDER — ALPRAZOLAM 0.5 MG PO TABS
0.5000 mg | ORAL_TABLET | Freq: Once | ORAL | Status: AC
  Administered 2021-08-13: 0.5 mg via ORAL
  Filled 2021-08-13: qty 1

## 2021-08-13 NOTE — ED Triage Notes (Signed)
Pt arrives pov, steady gait, able to speak in complete sentences, c/o shob and anxiety. Pt assessed by RT in triage. Endorses hydroxyzine pta. Denies CP

## 2021-08-13 NOTE — ED Notes (Signed)
Patient verbalizes understanding of discharge instructions. Opportunity for questioning and answers were provided. Armband removed by staff, pt discharged from ED. Ambulated out to lobby  

## 2021-08-13 NOTE — ED Notes (Signed)
Pt refused EKG.

## 2021-08-13 NOTE — ED Provider Notes (Signed)
[facility] EMERGENCY DEPARTMENT Provider Note   CSN: [id number] Arrival date & time: 08/13/21  1844     History  Chief Complaint  Patient presents with   Anxiety   Shortness of Breath    Alexander Adams is a 31 y.o. male.  Patient here with anxiety.  Has been having frequent what he thinks is panic attacks.  Was seen recently for a work-up which included blood work and chest x-ray and was normal.  He was given Vistaril today at urgent care.  He denies any suicidal homicidal ideation.  No ongoing chest pain or shortness of breath.  He took a dose of Vistaril prior to coming here and feeling slightly better.  He has been having some more frequent attacks of anxiety.  States in the past he used to have them but he has been doing well and then here in the last few weeks they have increased.  He denies any abdominal pain, nausea, vomiting.  Denies any alcohol or substance abuse.  Has been trying to find a therapist.  The history is provided by the patient.       Home Medications Prior to Admission medications   Medication Sig Start Date End Date Taking? Authorizing Provider  acetaminophen (TYLENOL) 500 MG tablet Take 1 tablet (500 mg total) by mouth every 6 (six) hours as needed. Patient not taking: Reported on 01/25/2021 02/20/18   Emi Holes, PA-C  diazepam (VALIUM) 2 MG tablet Take 1 tablet (2 mg total) by mouth every 8 (eight) hours as needed for anxiety. 08/05/21 08/05/22  Willy Eddy, MD  hydrOXYzine (ATARAX/VISTARIL) 25 MG tablet Take 1 tablet (25 mg total) by mouth every 6 (six) hours as needed for anxiety. Patient not taking: Reported on 01/25/2021 12/04/20   Cristina Gong, PA-C  ibuprofen (ADVIL,MOTRIN) 600 MG tablet Take 1 tablet (600 mg total) by mouth every 6 (six) hours as needed. Patient not taking: Reported on 01/25/2021 02/20/18   Emi Holes, PA-C  Omeprazole (PRILOSEC PO) Take by mouth. Patient not taking: Reported on 01/25/2021     [provider]  ondansetron (ZOFRAN ODT) 4 MG disintegrating tablet 4mg  ODT q4 hours prn nausea/vomit Patient not taking: Reported on 01/25/2021 10/16/16   10/18/16, MD  ondansetron (ZOFRAN ODT) 4 MG disintegrating tablet Take 1 tablet (4 mg total) by mouth every 8 (eight) hours as needed for nausea or vomiting. Patient not taking: Reported on 01/25/2021 02/15/19   02/17/19, MD  sertraline (ZOLOFT) 25 MG tablet Take by mouth. 12/06/20   [provider]  sertraline (ZOLOFT) 25 MG tablet Take 25 mg by mouth daily. 12/06/20   [provider]      Allergies    Patient has no known allergies.    Review of Systems   Review of Systems  Physical Exam Updated Vital Signs BP (!) 154/97   Pulse 70   Temp 98.2 F (36.8 C) (Oral)   Resp 20   Ht 5\' 8"  (1.727 m)   Wt 81.6 kg   SpO2 99%   BMI 27.37 kg/m  Physical Exam Vitals and nursing note reviewed.  Constitutional:      General: He is not in acute distress.    Appearance: He is well-developed.  HENT:     Head: Normocephalic and atraumatic.  Eyes:     Conjunctiva/sclera: Conjunctivae normal.  Cardiovascular:     Rate and Rhythm: Normal rate and regular rhythm.     Heart sounds: No  murmur heard. Pulmonary:     Effort: Pulmonary effort is normal. No respiratory distress.     Breath sounds: Normal breath sounds.  Abdominal:     Palpations: Abdomen is soft.     Tenderness: There is no abdominal tenderness.  Musculoskeletal:        General: No swelling.     Cervical back: Neck supple.  Skin:    General: Skin is warm and dry.     Capillary Refill: Capillary refill takes less than 2 seconds.  Neurological:     Mental Status: He is alert.  Psychiatric:        Mood and Affect: Mood is anxious.     Comments: Denies SI and HI.     ED Results / Procedures / Treatments   Labs (all labs ordered are listed, but only abnormal results are displayed) Labs Reviewed - No data to  display  EKG None  Radiology No results found.  Procedures Procedures    Medications Ordered in ED Medications  ALPRAZolam Prudy Feeler) tablet 0.5 mg (0.5 mg Oral Given 08/13/21 1927)    ED Course/ Medical Decision Making/ A&P                           Medical Decision Making Risk Prescription drug management.   Alexander Adams is here with anxiety symptoms.  Patient has been having anxiety issues of the last several weeks.  Per my review of his chart he had work-up last week that included troponin, EKG, chest x-ray and blood work that were all unremarkable.  He was seen in urgent care today and given Vistaril with some improvement.  He denies HI or SI.  He has been trying to find a therapist.  Overall he appears well.  I have no concern for PE or ACS or infectious process.  He is PERC negative.  He has no cardiac risk factors.  Recent cardiac work-up is unremarkable.  Overall patient was given a dose of Xanax with improvement.  I have given him some resources to follow-up with mental health providers.  Patient was discharged from the ED in good condition.  I offered him EKG but he declined.  This chart was dictated using voice recognition software.  Despite best efforts to proofread,  errors can occur which can change the documentation meaning.         Final Clinical Impression(s) / ED Diagnoses Final diagnoses:  Anxiety    Rx / DC Orders ED Discharge Orders     None         Virgina Norfolk, DO 08/13/21 1940

## 2021-08-17 ENCOUNTER — Encounter: Payer: Self-pay | Admitting: Emergency Medicine

## 2021-08-17 ENCOUNTER — Ambulatory Visit (INDEPENDENT_AMBULATORY_CARE_PROVIDER_SITE_OTHER): Payer: PRIVATE HEALTH INSURANCE | Admitting: Emergency Medicine

## 2021-08-17 VITALS — BP 140/84 | HR 62 | Temp 97.7°F | Ht 68.0 in | Wt 181.4 lb

## 2021-08-17 DIAGNOSIS — F418 Other specified anxiety disorders: Secondary | ICD-10-CM

## 2021-08-17 DIAGNOSIS — F419 Anxiety disorder, unspecified: Secondary | ICD-10-CM | POA: Diagnosis not present

## 2021-08-17 MED ORDER — VORTIOXETINE HBR 5 MG PO TABS: 5.0000 mg | ORAL_TABLET | Freq: Every day | ORAL | 1 refills | Status: DC

## 2021-08-17 NOTE — Assessment & Plan Note (Signed)
Clearly patient having panic attacks triggered by high anxiety situations. Vistaril has helped some.  May need long-term treatment. Will benefit from behavioral health evaluation. Recommend to start low-dose Trintellix 5 mg daily.  We will titrate up if tolerated. Stress management maneuvers and approach discussed with patient.

## 2021-08-17 NOTE — Progress Notes (Signed)
Alexander Adams 31 y.o.   Chief Complaint  Patient presents with   Shortness of Breath    On and off 3 weeks     HISTORY OF PRESENT ILLNESS: This is a 31 y.o. male complaining of episodes of difficulty breathing triggered by stressful events.  Seen in the emergency department twice in the last couple of weeks for the swelling.  Negative work-up including chest x-ray, EKG, blood work. No other complaints, associated symptomatology, or any other medical concerns. Non-smoker.  No history of asthma.  Exercises regularly. Healthy lifestyle.  Shortness of Breath Pertinent negatives include no abdominal pain, chest pain, fever, headaches, rash, sore throat or vomiting.     Prior to Admission medications   Medication Sig Start Date End Date Taking? Authorizing Provider  vortioxetine HBr (TRINTELLIX) 5 MG TABS tablet Take 1 tablet (5 mg total) by mouth daily. 08/17/21 02/13/22 Yes Adalina Dopson, Eilleen Kempf, MD  acetaminophen (TYLENOL) 500 MG tablet Take 1 tablet (500 mg total) by mouth every 6 (six) hours as needed. Patient not taking: Reported on 01/25/2021 02/20/18   Emi Holes, PA-C  diazepam (VALIUM) 2 MG tablet Take 1 tablet (2 mg total) by mouth every 8 (eight) hours as needed for anxiety. Patient not taking: Reported on 08/17/2021 08/05/21 08/05/22  Willy Eddy, MD  hydrOXYzine (ATARAX/VISTARIL) 25 MG tablet Take 1 tablet (25 mg total) by mouth every 6 (six) hours as needed for anxiety. Patient not taking: Reported on 01/25/2021 12/04/20   Cristina Gong, PA-C  Omeprazole (PRILOSEC PO) Take by mouth. Patient not taking: Reported on 01/25/2021    [provider]  ondansetron (ZOFRAN ODT) 4 MG disintegrating tablet 4mg  ODT q4 hours prn nausea/vomit Patient not taking: Reported on 01/25/2021 10/16/16   10/18/16, MD  ondansetron (ZOFRAN ODT) 4 MG disintegrating tablet Take 1 tablet (4 mg total) by mouth every 8 (eight) hours as needed for nausea or vomiting. Patient not  taking: Reported on 01/25/2021 02/15/19   02/17/19, MD    No Known Allergies  Patient Active Problem List   Diagnosis Date Noted   Foot pain 01/12/2013    Past Medical History:  Diagnosis Date   GERD (gastroesophageal reflux disease)    Reflux     No past surgical history on file.  Social History   Socioeconomic History   Marital status: Single    Spouse name: Not on file   Number of children: Not on file   Years of education: Not on file   Highest education level: Not on file  Occupational History   Not on file  Tobacco Use   Smoking status: Former    Types: Cigarettes    Quit date: 08/17/2009    Years since quitting: 12.0   Smokeless tobacco: Never  Vaping Use   Vaping Use: Never used  Substance and Sexual Activity   Alcohol use: No   Drug use: Not Currently    Types: Marijuana    Comment: Using CBD currently   Sexual activity: Not on file  Other Topics Concern   Not on file  Social History Narrative   Not on file   Social Determinants of Health   Financial Resource Strain: Not on file  Food Insecurity: Not on file  Transportation Needs: Not on file  Physical Activity: Not on file  Stress: Not on file  Social Connections: Not on file  Intimate Partner Violence: Not on file    Family History  Problem Relation Age of Onset  Diabetes Father    Hypertension Father      Review of Systems  Constitutional: Negative.  Negative for chills and fever.  HENT: Negative.  Negative for congestion and sore throat.   Respiratory:  Positive for shortness of breath. Negative for cough.   Cardiovascular: Negative.  Negative for chest pain and palpitations.  Gastrointestinal: Negative.  Negative for abdominal pain, diarrhea, nausea and vomiting.  Genitourinary: Negative.  Negative for dysuria and hematuria.  Musculoskeletal: Negative.   Skin: Negative.  Negative for rash.  Neurological:  Negative for dizziness and headaches.  All other systems reviewed  and are negative.  Today's Vitals   08/17/21 1532  BP: 140/84  Pulse: 62  Temp: 97.7 F (36.5 C)  TempSrc: Oral  SpO2: 99%  Weight: 181 lb 6 oz (82.3 kg)  Height: 5\' 8"  (1.727 m)   Body mass index is 27.58 kg/m.   Physical Exam Vitals reviewed.  Constitutional:      Appearance: He is well-developed.  HENT:     Head: Normocephalic.     Mouth/Throat:     Mouth: Mucous membranes are moist.     Pharynx: Oropharynx is clear.  Eyes:     Extraocular Movements: Extraocular movements intact.     Conjunctiva/sclera: Conjunctivae normal.     Pupils: Pupils are equal, round, and reactive to light.  Cardiovascular:     Rate and Rhythm: Normal rate and regular rhythm.     Pulses: Normal pulses.     Heart sounds: Normal heart sounds.  Pulmonary:     Effort: Pulmonary effort is normal.     Breath sounds: Normal breath sounds.  Abdominal:     Palpations: Abdomen is soft.     Tenderness: There is no abdominal tenderness.  Musculoskeletal:        General: Normal range of motion.     Cervical back: No tenderness.  Lymphadenopathy:     Cervical: No cervical adenopathy.  Skin:    General: Skin is warm and dry.     Capillary Refill: Capillary refill takes less than 2 seconds.  Neurological:     General: No focal deficit present.     Mental Status: He is alert and oriented to person, place, and time.  Psychiatric:        Mood and Affect: Mood normal.        Behavior: Behavior normal.      ASSESSMENT & PLAN: Problem List Items Addressed This Visit       Other   Chronic anxiety - Primary    Clearly patient having panic attacks triggered by high anxiety situations. Vistaril has helped some.  May need long-term treatment. Will benefit from behavioral health evaluation. Recommend to start low-dose Trintellix 5 mg daily.  We will titrate up if tolerated. Stress management maneuvers and approach discussed with patient.      Relevant Medications   vortioxetine HBr (TRINTELLIX)  5 MG TABS tablet   Other Relevant Orders   Ambulatory referral to Psychiatry   Situational anxiety   Relevant Medications   vortioxetine HBr (TRINTELLIX) 5 MG TABS tablet   Other Relevant Orders   Ambulatory referral to Psychiatry   Patient Instructions  Panic Attack A panic attack is when you suddenly feel very afraid, uncomfortable, or nervous (anxious). A panic attack can happen when you are scared, or it may happen for no reason. A panic attack can feel like a heart attack or stroke. See your doctor when you have a panic attack to  make sure you are not having a heart attack or stroke. What are the causes? Experiencing things that threaten your life, such as a war. Feeling worried or nervous for a long time (anxiety disorder). Being sad (depressed). Panic disorder. Certain medical conditions. Other causes may include: Certain medicines. Taking certain supplements. Illegal drugs. What increases the risk? Having another mental health condition. Using alcohol or drugs. Being under a lot of stress. Having events in your life that cause worry and sadness. What are the signs or symptoms? A panic attack: Starts suddenly. May last 5-10 minutes. Symptoms include one or more of these: A pounding heart. A feeling that your heart is beating in an unusual way or faster than normal (palpitations). Sweating or shaking. Feeling short of breath. Chest pain. Feeling like you may vomit (nauseous). Feeling dizzy or like you might faint. Other symptoms may include: Chills or hot flashes. Numbness or tingling in your lips, hands, or feet. Feeling confused. Fear of losing control. Fear of dying. How is this treated? A panic attack is a symptom of another condition. Treatment depends on the cause of the panic attack. If the cause is a medical problem, your doctor will treat that problem or refer you to a specialist. If the cause is emotional, you may be given medicines or referred to a  counselor. If the cause is a medicine, your doctor may tell you to stop the medicine, change your dose, or take a different medicine. If the cause is an illegal drug, treatment may involve letting the drug wear off and taking medicine to help the drug leave your body or to stop its effects. Attacks caused by heavy drug use may continue even if you stop using the drug. Most panic attacks go away after the cause is treated. Follow these instructions at home: Alcohol use Do not drink alcohol if: Your doctor tells you not to drink. You are pregnant, may be pregnant, or are planning to become pregnant. If you drink alcohol: Limit how much you have to: 0-1 drink a day for women. 0-2 drinks a day for men. Know how much alcohol is in your drink. In the [place] one drink equals one 12 oz bottle of beer (355 mL), one 5 oz glass of wine (148 mL), or one 1 oz glass of hard liquor (44 mL). General instructions  Take over-the-counter and prescription medicines only as told by your doctor. If you feel worried or nervous, try not to have caffeine. Take good care of your health. To do this: Eat healthy. Make sure to eat fresh fruits and vegetables, whole grains, lean meats, and low-fat dairy. Get enough sleep. Try to sleep for 7-8 hours each night. Exercise. Try to be active for 30 minutes 5 or more days a week. Do not smoke or use any products that contain nicotine or tobacco. If you need help quitting, ask your doctor. Keep all follow-up visits. Where to find more information Substance Abuse and Mental Health Services Administration [facility]): Rock[employer].pl [employer] [facility]): http://www.maynard.net/ Contact a doctor if: Your symptoms do not get better. Your symptoms get worse. You are not able to take your medicines as told. Get help right away if: You have thoughts of hurting yourself or others. Get help right away if you feel like you may hurt yourself or others, or have thoughts  about taking your own life. Go to your nearest emergency room or: Call 911. Call the National Suicide Prevention Lifeline at 4847978548 or 988.  This is open 24 hours a day. Text the Crisis Text Line at 706-853-5355. Summary A panic attack is when you suddenly feel very afraid, uncomfortable, or nervous (anxious). See your doctor when you have a panic attack to make sure that you do not have another serious problem. If you feel like you may hurt yourself or others, get help right away. Call 911. This information is not intended to replace advice given to you by your health care provider. Make sure you discuss any questions you have with your health care provider. Document Revised: 09/23/2020 Document Reviewed: 09/23/2020 Elsevier Patient Education  2023 Elsevier Inc.     Edwina Barth, MD [facility]

## 2021-08-17 NOTE — Patient Instructions (Signed)
Panic Attack A panic attack is when you suddenly feel very afraid, uncomfortable, or nervous (anxious). A panic attack can happen when you are scared, or it may happen for no reason. A panic attack can feel like a heart attack or stroke. See your doctor when you have a panic attack to make sure you are not having a heart attack or stroke. What are the causes? Experiencing things that threaten your life, such as a war. Feeling worried or nervous for a long time (anxiety disorder). Being sad (depressed). Panic disorder. Certain medical conditions. Other causes may include: Certain medicines. Taking certain supplements. Illegal drugs. What increases the risk? Having another mental health condition. Using alcohol or drugs. Being under a lot of stress. Having events in your life that cause worry and sadness. What are the signs or symptoms? A panic attack: Starts suddenly. May last 5-10 minutes. Symptoms include one or more of these: A pounding heart. A feeling that your heart is beating in an unusual way or faster than normal (palpitations). Sweating or shaking. Feeling short of breath. Chest pain. Feeling like you may vomit (nauseous). Feeling dizzy or like you might faint. Other symptoms may include: Chills or hot flashes. Numbness or tingling in your lips, hands, or feet. Feeling confused. Fear of losing control. Fear of dying. How is this treated? A panic attack is a symptom of another condition. Treatment depends on the cause of the panic attack. If the cause is a medical problem, your doctor will treat that problem or refer you to a specialist. If the cause is emotional, you may be given medicines or referred to a counselor. If the cause is a medicine, your doctor may tell you to stop the medicine, change your dose, or take a different medicine. If the cause is an illegal drug, treatment may involve letting the drug wear off and taking medicine to help the drug leave your  body or to stop its effects. Attacks caused by heavy drug use may continue even if you stop using the drug. Most panic attacks go away after the cause is treated. Follow these instructions at home: Alcohol use Do not drink alcohol if: Your doctor tells you not to drink. You are pregnant, may be pregnant, or are planning to become pregnant. If you drink alcohol: Limit how much you have to: 0-1 drink a day for women. 0-2 drinks a day for men. Know how much alcohol is in your drink. In the [place] one drink equals one 12 oz bottle of beer (355 mL), one 5 oz glass of wine (148 mL), or one 1 oz glass of hard liquor (44 mL). General instructions  Take over-the-counter and prescription medicines only as told by your doctor. If you feel worried or nervous, try not to have caffeine. Take good care of your health. To do this: Eat healthy. Make sure to eat fresh fruits and vegetables, whole grains, lean meats, and low-fat dairy. Get enough sleep. Try to sleep for 7-8 hours each night. Exercise. Try to be active for 30 minutes 5 or more days a week. Do not smoke or use any products that contain nicotine or tobacco. If you need help quitting, ask your doctor. Keep all follow-up visits. Where to find more information Substance Abuse and Mental Health Services Administration (SAMHSA): samhsa.gov National Institute of Mental Health (NIMH): www.nimh.nih.gov Contact a doctor if: Your symptoms do not get better. Your symptoms get worse. You are not able to take your medicines as told. Get help   right away if: You have thoughts of hurting yourself or others. Get help right away if you feel like you may hurt yourself or others, or have thoughts about taking your own life. Go to your nearest emergency room or: Call 911. Call the National Suicide Prevention Lifeline at 1-800-273-8255 or 988. This is open 24 hours a day. Text the Crisis Text Line at 741741. Summary A panic attack is when you suddenly feel  very afraid, uncomfortable, or nervous (anxious). See your doctor when you have a panic attack to make sure that you do not have another serious problem. If you feel like you may hurt yourself or others, get help right away. Call 911. This information is not intended to replace advice given to you by your health care provider. Make sure you discuss any questions you have with your health care provider. Document Revised: 09/23/2020 Document Reviewed: 09/23/2020 Elsevier Patient Education  2023 Elsevier Inc.  

## 2021-09-01 ENCOUNTER — Encounter: Payer: Self-pay | Admitting: Emergency Medicine

## 2021-09-01 NOTE — Telephone Encounter (Signed)
I think he should be seen at an urgent care center as soon as possible given his worsening symptoms.  Thanks.

## 2021-09-01 NOTE — Telephone Encounter (Signed)
Called patient and was unable to leave message due to mailbox being full. Provider recommendation is to go to Urgent Care

## 2021-09-02 LAB — TSH: TSH: 0.58 (ref 0.41–5.90)

## 2021-09-02 LAB — COMPREHENSIVE METABOLIC PANEL: Calcium: 10.4 (ref 8.7–10.7)

## 2021-09-05 ENCOUNTER — Encounter: Payer: Self-pay | Admitting: Emergency Medicine

## 2021-09-05 NOTE — Telephone Encounter (Signed)
Recommend to follow-up with [facility], people who ordered these tests.  The thyroid results raise the possibility of hyperthyroidism which can create a chronic anxiety state.  Recommend evaluation by endocrinologist.

## 2021-09-08 ENCOUNTER — Other Ambulatory Visit: Payer: Self-pay | Admitting: Emergency Medicine

## 2021-09-08 DIAGNOSIS — E039 Hypothyroidism, unspecified: Secondary | ICD-10-CM

## 2021-09-08 NOTE — Telephone Encounter (Signed)
Endocrinology referral placed today.  Thanks.

## 2021-09-15 NOTE — Progress Notes (Signed)
Synopsis: Referred for dyspnea by Georgina Quint, *  Subjective:   PATIENT ID: Alexander Adams GENDER: male DOB: 03/14/1990, MRN: [id number]  Chief Complaint  Patient presents with   Pulmonary Consult    Self referral. Pt c/o SOB off and on for the past 2 months. He feels like is is unable to take a good, deep breath. He sometimes feels like he has some congestion in his chest. He says he has always been sensitive to strong smells and feels that the air quality has had effect on his breathing.    31yM with GERD, anxiety, covid-19 infection 01/2019 symptomatically severe but not hospitalized referred for dyspnea on and off for the last couple of months. Has trouble getting full deep breath sometimes. Notes sensation of chest congestion. Sensitive to strong fragrances and worries about effect of poor air quality on his breathing. He has no accompanying throat tightness with episodes.   Took epinephrine inhaler OTC on his way to comedy show and then had racing heart rate and palpitations. Then went to ED. Hasn't had course of steroids before for this. No cough.   Has never seen pulmonologist before.   Otherwise pertinent review of systems is negative.  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 Exercises frequently. 1 year of cigarettes when 15, did smoke MJ a few hits - typically joints/bowl or sometimes wax with vape, nicotine vape for 6 months last year and has quit all of that. He has no pets  Past Medical History:  Diagnosis Date   GERD (gastroesophageal reflux disease)    Reflux      Family History  Problem Relation Age of Onset   Diabetes Father    Hypertension Father      No past surgical history on file.  Social History   Socioeconomic History   Marital status: Single    Spouse name: Not on file   Number of children: Not on file   Years of education: Not on file   Highest education level: Not on file  Occupational History   Not on file   Tobacco Use   Smoking status: Former    Packs/day: 0.25    Years: 1.00    Total pack years: 0.25    Types: Cigarettes    Quit date: 02/27/2005    Years since quitting: 16.5   Smokeless tobacco: Never  Vaping Use   Vaping Use: Former   Start date: 07/28/2020   Quit date: 02/27/2021  Substance and Sexual Activity   Alcohol use: No   Drug use: Not Currently    Types: Marijuana    Comment: Using CBD currently   Sexual activity: Not on file  Other Topics Concern   Not on file  Social History Narrative   Not on file   Social Determinants of Health   Financial Resource Strain: Not on file  Food Insecurity: Not on file  Transportation Needs: Not on file  Physical Activity: Not on file  Stress: Not on file  Social Connections: Not on file  Intimate Partner Violence: Not on file     No Known Allergies   Outpatient Medications Prior to Visit  Medication Sig Dispense Refill   acetaminophen (TYLENOL) 500 MG tablet Take 1 tablet (500 mg total) by mouth every 6 (six) hours as needed. (Patient not taking: Reported on 01/25/2021) 30 tablet 0   diazepam (VALIUM) 2 MG tablet Take 1 tablet (2 mg total) by mouth every  8 (eight) hours as needed for anxiety. (Patient not taking: Reported on 08/17/2021) 4 tablet 0   hydrOXYzine (ATARAX/VISTARIL) 25 MG tablet Take 1 tablet (25 mg total) by mouth every 6 (six) hours as needed for anxiety. (Patient not taking: Reported on 01/25/2021) 20 tablet 0   Omeprazole (PRILOSEC PO) Take by mouth. (Patient not taking: Reported on 01/25/2021)     ondansetron (ZOFRAN ODT) 4 MG disintegrating tablet 4mg  ODT q4 hours prn nausea/vomit (Patient not taking: Reported on 01/25/2021) 4 tablet 0   ondansetron (ZOFRAN ODT) 4 MG disintegrating tablet Take 1 tablet (4 mg total) by mouth every 8 (eight) hours as needed for nausea or vomiting. (Patient not taking: Reported on 01/25/2021) 12 tablet 0   vortioxetine HBr (TRINTELLIX) 5 MG TABS tablet Take 1 tablet (5 mg total) by  mouth daily. (Patient not taking: Reported on 09/16/2021) 90 tablet 1   No facility-administered medications prior to visit.       Objective:   Physical Exam:  General appearance: 31 y.o., male, NAD, conversant  Eyes: anicteric sclerae; PERRL, tracking appropriately HENT: NCAT; MMM Neck: Trachea midline; no lymphadenopathy, no JVD Lungs: CTAB, no crackles, no wheeze, with normal respiratory effort CV: RRR, no murmur  Abdomen: Soft, non-tender; non-distended, BS present  Extremities: No peripheral edema, warm Skin: Normal turgor and texture; no rash Psych: Appropriate affect Neuro: Alert and oriented to person and place, no focal deficit     Vitals:   09/16/21 1436  BP: 138/88  Pulse: 93  Temp: 98 F (36.7 C)  TempSrc: Oral  SpO2: 98%  Weight: 181 lb (82.1 kg)  Height: 5\' 8"  (1.727 m)   98% on RA BMI Readings from Last 3 Encounters:  09/16/21 27.52 kg/m  08/17/21 27.58 kg/m  08/13/21 27.37 kg/m   Wt Readings from Last 3 Encounters:  09/16/21 181 lb (82.1 kg)  08/17/21 181 lb 6 oz (82.3 kg)  08/13/21 180 lb (81.6 kg)     CBC    Component Value Date/Time   WBC 11.0 (H) 08/05/2021 1739   RBC 5.06 08/05/2021 1739   HGB 14.4 08/05/2021 1739   HCT 41.9 08/05/2021 1739   PLT 294 08/05/2021 1739   MCV 82.8 08/05/2021 1739   MCH 28.5 08/05/2021 1739   MCHC 34.4 08/05/2021 1739   RDW 13.0 08/05/2021 1739    Tsh ~0.5, fT4 1.84  Chest Imaging: CXR 08/05/21 reviewed by me unremarkable  Pulmonary Functions Testing Results:     No data to display              Assessment & Plan:   # Episodic dyspnea Unclear what extent this could be driven by borderline hyperthyroid. Sensitivity to strong fragrances, poor air quality could alternatively be manifestation of small airways disease.   Plan: - has follow up in August with endocrinology - we will check PFTs (breathing tests) before and after xopenex inhaler to see if there's any evidence of asthma or  alternative small airways disease in 3 months. If you're feeling 100% better before this appointment then feel free to cancel breathing tests and/or appointment  - see you in 3 months or sooner if need be!     10/05/21, MD [facility] 09/16/2021 2:56 PM

## 2021-09-16 ENCOUNTER — Encounter: Payer: Self-pay | Admitting: Student

## 2021-09-16 ENCOUNTER — Ambulatory Visit (INDEPENDENT_AMBULATORY_CARE_PROVIDER_SITE_OTHER): Payer: PRIVATE HEALTH INSURANCE | Admitting: Student

## 2021-09-16 VITALS — BP 138/88 | HR 93 | Temp 98.0°F | Ht 68.0 in | Wt 181.0 lb

## 2021-09-16 DIAGNOSIS — R0600 Dyspnea, unspecified: Secondary | ICD-10-CM

## 2021-09-16 NOTE — Patient Instructions (Signed)
-   we will check PFTs (breathing tests) before and after xopenex inhaler to see if there's any evidence of asthma or alternative small airways disease in 3 months. If you're feeling 100% better before this appointment then feel free to cancel breathing tests and/or appointment  - see you in 3 months or sooner if need be!

## 2021-09-29 ENCOUNTER — Ambulatory Visit: Payer: PRIVATE HEALTH INSURANCE | Admitting: Emergency Medicine

## 2021-10-14 ENCOUNTER — Encounter: Payer: Self-pay | Admitting: Emergency Medicine

## 2021-10-15 ENCOUNTER — Other Ambulatory Visit: Payer: Self-pay | Admitting: Emergency Medicine

## 2021-10-15 DIAGNOSIS — F418 Other specified anxiety disorders: Secondary | ICD-10-CM

## 2021-10-15 MED ORDER — ALPRAZOLAM 0.5 MG PO TABS: ORAL_TABLET | ORAL | 0 refills | Status: DC

## 2021-10-15 NOTE — Telephone Encounter (Signed)
New prescription for Xanax sent to pharmacy of record.  Thanks.

## 2021-10-19 ENCOUNTER — Other Ambulatory Visit: Payer: Self-pay | Admitting: Emergency Medicine

## 2021-10-19 DIAGNOSIS — F418 Other specified anxiety disorders: Secondary | ICD-10-CM

## 2021-10-19 MED ORDER — ALPRAZOLAM 0.5 MG PO TABS: ORAL_TABLET | ORAL | 0 refills | Status: DC

## 2021-10-19 NOTE — Telephone Encounter (Signed)
Prescription sent to pharmacy requested.  Thanks.

## 2021-10-19 NOTE — Telephone Encounter (Signed)
Can you send the patient xanax to [facility] on [address]? Thank you

## 2021-10-24 ENCOUNTER — Telehealth: Payer: Self-pay | Admitting: Student

## 2021-10-26 ENCOUNTER — Ambulatory Visit (INDEPENDENT_AMBULATORY_CARE_PROVIDER_SITE_OTHER): Payer: Commercial Managed Care - HMO | Admitting: "Endocrinology

## 2021-10-26 ENCOUNTER — Encounter: Payer: Self-pay | Admitting: "Endocrinology

## 2021-10-26 VITALS — BP 130/82 | HR 60 | Ht 68.0 in | Wt 188.4 lb

## 2021-10-26 DIAGNOSIS — E782 Mixed hyperlipidemia: Secondary | ICD-10-CM

## 2021-10-26 DIAGNOSIS — E0590 Thyrotoxicosis, unspecified without thyrotoxic crisis or storm: Secondary | ICD-10-CM | POA: Diagnosis not present

## 2021-10-26 NOTE — Progress Notes (Signed)
Endocrinology Consult Note                                            10/26/2021, 5:09 PM   Subjective:    Patient ID: Alexander Adams, male    DOB: 1990-07-18, PCP Horald Pollen, MD   Past Medical History:  Diagnosis Date   GERD (gastroesophageal reflux disease)    Reflux    History reviewed. No pertinent surgical history. Social History   Socioeconomic History   Marital status: Single    Spouse name: Not on file   Number of children: Not on file   Years of education: Not on file   Highest education level: Not on file  Occupational History   Not on file  Tobacco Use   Smoking status: Former    Packs/day: 0.25    Years: 1.00    Total pack years: 0.25    Types: Cigarettes    Quit date: 02/27/2005    Years since quitting: 16.6   Smokeless tobacco: Never  Vaping Use   Vaping Use: Former   Start date: 07/28/2020   Quit date: 02/27/2021  Substance and Sexual Activity   Alcohol use: No   Drug use: Not Currently    Types: Marijuana    Comment: Using CBD currently   Sexual activity: Not on file  Other Topics Concern   Not on file  Social History Narrative   Not on file   Social Determinants of Health   Financial Resource Strain: Not on file  Food Insecurity: Not on file  Transportation Needs: Not on file  Physical Activity: Not on file  Stress: Not on file  Social Connections: Not on file   Family History  Problem Relation Age of Onset   Diabetes Father    Hypertension Father    Heart failure Father    Outpatient Encounter Medications as of 10/26/2021  Medication Sig   hydrOXYzine (ATARAX/VISTARIL) 25 MG tablet Take 1 tablet (25 mg total) by mouth every 6 (six) hours as needed for anxiety.   MAGNESIUM OXIDE 400 PO Take 1 tablet by mouth daily.   ALPRAZolam (XANAX) 0.5 MG tablet Take before flying as needed.   [DISCONTINUED] acetaminophen (TYLENOL) 500 MG tablet Take 1 tablet (500 mg total) by mouth every 6 (six) hours as needed. (Patient not  taking: Reported on 01/25/2021)   [DISCONTINUED] Omeprazole (PRILOSEC PO) Take by mouth. (Patient not taking: Reported on 01/25/2021)   [DISCONTINUED] ondansetron (ZOFRAN ODT) 4 MG disintegrating tablet 4mg  ODT q4 hours prn nausea/vomit (Patient not taking: Reported on 01/25/2021)   [DISCONTINUED] ondansetron (ZOFRAN ODT) 4 MG disintegrating tablet Take 1 tablet (4 mg total) by mouth every 8 (eight) hours as needed for nausea or vomiting. (Patient not taking: Reported on 01/25/2021)   [DISCONTINUED] vortioxetine HBr (TRINTELLIX) 5 MG TABS tablet Take 1 tablet (5 mg total) by mouth daily. (Patient not taking: Reported on 09/16/2021)   No facility-administered encounter medications on file as of 10/26/2021.   ALLERGIES: No Known Allergies  VACCINATION STATUS:  There is no immunization history on file for this patient.  HPI Alexander Adams is 31 y.o. male who presents today with a medical history as above. he is being seen in consultation for hyperthyroidism requested by Horald Pollen, MD.   For several weeks now, he has been dealing with anxiety,  palpitations, sleep disturbance, irritability, and mild tremors. He had to visit ER for anxiety in July.  He was found to have slight elevation in his free T41.85 (normal 0.82-1.77).  His TSH was normal at 0.584. He denies any prior knowledge of hyperthyroidism or thyroid dysfunction.  Denies any family history of thyroid dysfunction or thyroid malignancy.  Was also found to have mild elevation of calcium at 10.4.  He is not on any antithyroid intervention at this time.  He has tried a diet program to lose weight, but does not recall if he had any thyroid hormone in it.  Currently,  More recently he has started a diet program but not gaining weight.   Review of Systems  Constitutional: + Fluctuating body weight, + fatigue, no subjective hyperthermia, no subjective hypothermia Eyes: no blurry vision, no xerophthalmia ENT: no sore throat, no  nodules palpated in throat, no dysphagia/odynophagia, no hoarseness Cardiovascular: no Chest Pain, no Shortness of Breath, no palpitations, no leg swelling Respiratory: no cough, + exertional shortness of breath Gastrointestinal: no Nausea/Vomiting/Diarhhea Musculoskeletal: no muscle/joint aches Skin: no rashes Neurological: no tremors, no numbness, no tingling, no dizziness Psychiatric: no depression, no anxiety  Objective:       10/26/2021    2:20 PM 09/16/2021    2:36 PM 08/17/2021    3:32 PM  Vitals with BMI  Height 5\' 8"  5\' 8"  5\' 8"   Weight 188 lbs 6 oz 181 lbs 181 lbs 6 oz  BMI 28.65 27.53 27.58  Systolic 130 138  Diastolic 82 88 84  Pulse 60 93 62    BP 130/82   Pulse 60   Ht 5\' 8"  (1.727 m)   Wt 188 lb 6.4 oz (85.5 kg)   BMI 28.65 kg/m   Wt Readings from Last 3 Encounters:  10/26/21 188 lb 6.4 oz (85.5 kg)  09/16/21 181 lb (82.1 kg)  08/17/21 181 lb 6 oz (82.3 kg)    Physical Exam  Constitutional:  Body mass index is 28.65 kg/m.,  not in acute distress, normal state of mind Eyes: PERRLA, EOMI, no exophthalmos ENT: moist mucous membranes, no gross thyromegaly, no gross cervical lymphadenopathy Cardiovascular: + normal precordial activity, Regular Rate and Rhythm, no Murmur/Rubs/Gallops Respiratory:  adequate breathing efforts, no gross chest deformity, Clear to auscultation bilaterally Gastrointestinal: abdomen soft, Non -tender, No distension, Bowel Sounds present, no gross organomegaly Musculoskeletal: no gross deformities, strength intact in all four extremities Skin: moist, warm, no rashes Neurological: + tremor with outstretched hands, Deep tendon reflexes normal in bilateral lower extremities.  CMP ( most recent) CMP     Component Value Date/Time   NA 135 08/05/2021 1739   K 2.9 (L) 08/05/2021 1739   CL 107 08/05/2021 1739   CO2 17 (L) 08/05/2021 1739   GLUCOSE 162 (H) 08/05/2021 1739   BUN 17 08/05/2021 1739   CREATININE 0.83 08/05/2021 1739    CALCIUM 10.4 09/02/2021 0000   PROT 7.0 01/25/2021 1015   ALBUMIN 4.5 01/25/2021 1015   AST 13 01/25/2021 1015   ALT 21 01/25/2021 1015   ALKPHOS 49 01/25/2021 1015   BILITOT 0.7 01/25/2021 1015   GFRNONAA >60 08/05/2021 1739   GFRAA >60 02/15/2019 0741     Diabetic Labs (most recent): Lab Results  Component Value Date   HGBA1C 5.2 01/25/2021     Lipid Panel ( most recent) Lipid Panel     Component Value Date/Time   CHOL 176 01/25/2021 1015   TRIG 66.0 01/25/2021 1015  HDL 44.10 01/25/2021 1015   CHOLHDL 4 01/25/2021 1015   VLDL 13.2 01/25/2021 1015   LDLCALC 119 (H) 01/25/2021 1015     September 02, 2021 labs: Free T4 185, TSH 0.584, calcium 10.4    Assessment & Plan:   1. Hyperthyroidism 2. Hypercalcemia   - Alexander Adams  is being seen at a kind request of Alvy Bimler, Eilleen Kempf, MD. - I have reviewed his available thyroid records and clinically evaluated the patient. - Based on these reviews, he has mild elevation of his free T4 and calcium,  however,  there is not sufficient information to proceed with definitive treatment plan. It is possible that he has primary hyperparathyroidism.  He will need a more complete work-up.  He will be sent to lab for - TSH - T4, free - T3, free - Thyroid peroxidase antibody - Thyroglobulin antibody  If his repeat labs indicate hyperthyroidism, he will be considered for thyroid uptake and scan. His pulse rate is 60, does not need beta-blocker for now.  In light of his mild hypercalcemia of 10.4 in July 2023, he is offered repeat calcium/PTH.  He has hyperlipidemia, not on treatment.  Will be given lifestyle medicine on next visit.  - I did not initiate any new prescriptions today. - he is advised to maintain close follow up with Georgina Quint, MD for primary care needs.   - Time spent with the patient: 50 minutes, of which >50% was spent in  counseling him about his abnormal thyroid function test and hypocalcemia  and the rest in obtaining information about his symptoms, reviewing his previous labs/studies ( including abstractions from other facilities),  evaluations, and treatments,  and developing a plan to confirm diagnosis and long term treatment based on the latest standards of care/guidelines; and documenting his care.  [name] participated in the discussions, expressed understanding, and voiced agreement with the above plans.  All questions were answered to his satisfaction. he is encouraged to contact clinic should he have any questions or concerns prior to his return visit.  Follow up plan: Return in about 2 weeks (around 11/09/2021) for Labs Today- Non-Fasting Ok.   Marquis Lunch, MD [facility] [address] [place], [place] [address] Phone: [phone]  Fax: [phone]     10/26/2021, 5:09 PM  This note was partially dictated with voice recognition software. Similar sounding words can be transcribed inadequately or may not  be corrected upon review.

## 2021-10-28 LAB — THYROID PEROXIDASE ANTIBODY: Thyroperoxidase Ab SerPl-aCnc: <9 IU/mL (ref 0–34)

## 2021-10-28 LAB — THYROGLOBULIN ANTIBODY: Thyroglobulin Antibody: <1 IU/mL (ref 0.0–0.9)

## 2021-10-28 LAB — PTH, INTACT AND CALCIUM
Calcium: 10.3 mg/dL — ABNORMAL HIGH (ref 8.7–10.2)
PTH: 24 pg/mL (ref 15–65)

## 2021-10-28 LAB — T3, FREE: T3, Free: 4 pg/mL (ref 2.0–4.4)

## 2021-10-28 LAB — TSH: TSH: 1.01 u[IU]/mL (ref 0.450–4.500)

## 2021-10-28 LAB — T4, FREE: Free T4: 1.69 ng/dL (ref 0.82–1.77)

## 2021-10-28 NOTE — Telephone Encounter (Signed)
Called and spoke with pt who states that he recently had a sleep study performed and is wanting recommendations based off of the study. Asked pt who ordered sleep study and he said PCP. Stated to pt that we could not do anything without a sleep consult and pt verbalized understnading and said it was okay to schedule a consultation. Appt scheduled for pt with Dr. Val Eagle 10/2. Pt said he had sleep study performed at [facility] off [facility]. Request has been faxed to them to get sleep study. Nothing further needed.

## 2021-11-14 ENCOUNTER — Ambulatory Visit: Payer: Commercial Managed Care - HMO | Admitting: "Endocrinology

## 2021-11-23 ENCOUNTER — Ambulatory Visit: Payer: PRIVATE HEALTH INSURANCE | Admitting: Student

## 2021-11-28 ENCOUNTER — Institutional Professional Consult (permissible substitution): Payer: PRIVATE HEALTH INSURANCE | Admitting: Pulmonary Disease

## 2021-12-01 ENCOUNTER — Ambulatory Visit (INDEPENDENT_AMBULATORY_CARE_PROVIDER_SITE_OTHER): Payer: Commercial Managed Care - HMO | Admitting: Pulmonary Disease

## 2021-12-01 ENCOUNTER — Encounter: Payer: Self-pay | Admitting: Pulmonary Disease

## 2021-12-01 VITALS — BP 140/80 | HR 73 | Temp 97.9°F | Ht 68.0 in | Wt 188.6 lb

## 2021-12-01 DIAGNOSIS — G4733 Obstructive sleep apnea (adult) (pediatric): Secondary | ICD-10-CM | POA: Diagnosis not present

## 2021-12-01 NOTE — Patient Instructions (Signed)
Follow-up only as needed  Referral to dentist for an oral appliance for mild obstructive sleep apnea  History of mild obstructive sleep apnea with AHI of 13  Make sure you get a copy of the sleep study for the dental office to have-they need a positive study to fashion an oral device for sleep apnea  Call us with significant concerns

## 2021-12-01 NOTE — Telephone Encounter (Signed)
PCCs, please advise when referral is sent to orthodontist. Pt is requesting to know which office it is sent to. Thanks.

## 2021-12-01 NOTE — Progress Notes (Signed)
Alexander Adams    [id number]    1990/08/30  Primary Care Physician:Sagardia, Eilleen Kempf, MD  Referring Physician: Georgina Quint, MD [address] [place],  [place] [address]  Chief complaint:   Patient with a history of mild obstructive sleep apnea  HPI:  [facility] was told about an AHI of 13, desaturations to about 89% for 195-96  Study was initially performed for daytime fatigue, some anxiety, nonrestorative sleep  Usually goes to bed between 9 and 11 PM, falls asleep in 15 to 30 minutes 0-2 awakenings Final wake up time about 7 AM  Weight is generally stable but up about 5 to 10 pounds  Admits to snoring, admits to dryness of his mouth in the mornings, sometimes wakes up to drink a cup of water Denies headaches in the morning Memory is good  Both parents snored  Not on any medications long-term Was prescribed anxiety medications which she is not currently taking   Outpatient Encounter Medications as of 12/01/2021  Medication Sig   ALPRAZolam (XANAX) 0.5 MG tablet Take before flying as needed. (Patient not taking: Reported on 12/01/2021)   hydrOXYzine (ATARAX/VISTARIL) 25 MG tablet Take 1 tablet (25 mg total) by mouth every 6 (six) hours as needed for anxiety. (Patient not taking: Reported on 12/01/2021)   MAGNESIUM OXIDE 400 PO Take 1 tablet by mouth daily. (Patient not taking: Reported on 12/01/2021)   No facility-administered encounter medications on file as of 12/01/2021.    Allergies as of 12/01/2021   (No Known Allergies)    Past Medical History:  Diagnosis Date   GERD (gastroesophageal reflux disease)    Reflux     No past surgical history on file.  Family History  Problem Relation Age of Onset   Diabetes Father    Hypertension Father    Heart failure Father     Social History   Socioeconomic History   Marital status: Single    Spouse name: Not on file   Number of children: Not on file    Years of education: Not on file   Highest education level: Not on file  Occupational History   Not on file  Tobacco Use   Smoking status: Former    Packs/day: 0.25    Years: 1.00    Total pack years: 0.25    Types: Cigarettes    Quit date: 02/27/2005    Years since quitting: 16.7   Smokeless tobacco: Never  Vaping Use   Vaping Use: Former   Start date: 07/28/2020   Quit date: 02/27/2021  Substance and Sexual Activity   Alcohol use: No   Drug use: Not Currently    Types: Marijuana    Comment: Using CBD currently   Sexual activity: Not on file  Other Topics Concern   Not on file  Social History Narrative   Not on file   Social Determinants of Health   Financial Resource Strain: Not on file  Food Insecurity: Not on file  Transportation Needs: Not on file  Physical Activity: Not on file  Stress: Not on file  Social Connections: Not on file  Intimate Partner Violence: Not on file    Review of Systems  Respiratory:  Positive for apnea.   Psychiatric/Behavioral:  Positive for sleep disturbance.     Vitals:   12/01/21 1552  BP: (!) 140/80  Pulse: 73  Temp: 97.9 F (36.6 C)  SpO2:  98%     Physical Exam Constitutional:      Appearance: Normal appearance.  HENT:     Head: Normocephalic.     Mouth/Throat:     Mouth: Mucous membranes are moist.  Cardiovascular:     Rate and Rhythm: Normal rate and regular rhythm.     Heart sounds: No murmur heard.    No friction rub.  Pulmonary:     Effort: No respiratory distress.     Breath sounds: No stridor. No wheezing or rhonchi.  Musculoskeletal:     Cervical back: No rigidity or tenderness.  Neurological:     Mental Status: He is alert.  Psychiatric:        Mood and Affect: Mood normal.     Data Reviewed: Actual sleep study is not available for review at present but patient reports I was told about AHI of 13, desaturations to 89% from 95-96  Assessment:  History of mild obstructive sleep apnea  History of  snoring  Nonrestorative sleep  Daytime fatigue  Plan/Recommendations: Referral to dentist for an oral device  Patient will prefer an oral device rather than CPAP at present  He is aware of options of treatment for mild obstructive sleep apnea  I did encourage weight loss measures may also help mild sleep apnea and snoring  Encouraged to call with significant concerns   Sherrilyn Rist MD [facility] 12/01/2021, 4:26 PM  CC: Horald Pollen, *

## 2021-12-02 NOTE — Telephone Encounter (Signed)
Phillis Haggis we sent your referral today to Dr. Ron Parker here in [place]. The number for his office is [phone].

## 2021-12-12 NOTE — Progress Notes (Deleted)
Synopsis: Referred for dyspnea by Georgina Quint, *  Subjective:   PATIENT ID: Alexander Adams GENDER: male DOB: [birth year], MRN: [id number]  No chief complaint on file.  30yM with GERD, anxiety, covid-19 infection 01/2019 symptomatically severe but not hospitalized referred for dyspnea on and off for the last couple of months. Has trouble getting full deep breath sometimes. Notes sensation of chest congestion. Sensitive to strong fragrances and worries about effect of poor air quality on his breathing. He has no accompanying throat tightness with episodes.   Took epinephrine inhaler OTC on his way to comedy show and then had racing heart rate and palpitations. Then went to ED. Hasn't had course of steroids before for this. No cough.   Has never seen pulmonologist before.   Otherwise pertinent review of systems is negative.  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 Exercises frequently. 1 year of cigarettes when 15, did smoke MJ a few hits - typically joints/bowl or sometimes wax with vape, nicotine vape for 6 months last year and has quit all of that. He has no pets  Interval HPI:  Seen by Dr. Wynona Neat 10/5 and referral placed for [facility] for mild OSA  PFTs  Otherwise pertinent review of systems is negative.  Past Medical History:  Diagnosis Date   GERD (gastroesophageal reflux disease)    Reflux      Family History  Problem Relation Age of Onset   Diabetes Father    Hypertension Father    Heart failure Father      No past surgical history on file.  Social History   Socioeconomic History   Marital status: Single    Spouse name: Not on file   Number of children: Not on file   Years of education: Not on file   Highest education level: Not on file  Occupational History   Not on file  Tobacco Use   Smoking status: Former    Packs/day: 0.25    Years: 1.00    Total pack years: 0.25    Types: Cigarettes    Quit date: 02/27/2005    Years  since quitting: 16.8   Smokeless tobacco: Never  Vaping Use   Vaping Use: Former   Start date: 07/28/2020   Quit date: 02/27/2021  Substance and Sexual Activity   Alcohol use: No   Drug use: Not Currently    Types: Marijuana    Comment: Using CBD currently   Sexual activity: Not on file  Other Topics Concern   Not on file  Social History Narrative   Not on file   Social Determinants of Health   Financial Resource Strain: Not on file  Food Insecurity: Not on file  Transportation Needs: Not on file  Physical Activity: Not on file  Stress: Not on file  Social Connections: Not on file  Intimate Partner Violence: Not on file     No Known Allergies   Outpatient Medications Prior to Visit  Medication Sig Dispense Refill   ALPRAZolam (XANAX) 0.5 MG tablet Take before flying as needed. (Patient not taking: Reported on 12/01/2021) 20 tablet 0   hydrOXYzine (ATARAX/VISTARIL) 25 MG tablet Take 1 tablet (25 mg total) by mouth every 6 (six) hours as needed for anxiety. (Patient not taking: Reported on 12/01/2021) 20 tablet 0   MAGNESIUM OXIDE 400 PO Take 1 tablet by mouth daily. (Patient not taking: Reported on 12/01/2021)     No facility-administered medications prior to visit.  Objective:   Physical Exam:  General appearance: 31 y.o., male, NAD, conversant  Eyes: anicteric sclerae; PERRL, tracking appropriately HENT: NCAT; MMM Neck: Trachea midline; no lymphadenopathy, no JVD Lungs: CTAB, no crackles, no wheeze, with normal respiratory effort CV: RRR, no murmur  Abdomen: Soft, non-tender; non-distended, BS present  Extremities: No peripheral edema, warm Skin: Normal turgor and texture; no rash Psych: Appropriate affect Neuro: Alert and oriented to person and place, no focal deficit     There were no vitals filed for this visit.    on RA BMI Readings from Last 3 Encounters:  12/01/21 28.68 kg/m  10/26/21 28.65 kg/m  09/16/21 27.52 kg/m   Wt Readings from Last 3  Encounters:  12/01/21 188 lb 9.6 oz (85.5 kg)  10/26/21 188 lb 6.4 oz (85.5 kg)  09/16/21 181 lb (82.1 kg)     CBC    Component Value Date/Time   WBC 11.0 (H) 08/05/2021 1739   RBC 5.06 08/05/2021 1739   HGB 14.4 08/05/2021 1739   HCT 41.9 08/05/2021 1739   PLT 294 08/05/2021 1739   MCV 82.8 08/05/2021 1739   MCH 28.5 08/05/2021 1739   MCHC 34.4 08/05/2021 1739   RDW 13.0 08/05/2021 1739    Tsh ~0.5, fT4 1.84  Chest Imaging: CXR 08/05/21 reviewed by me unremarkable  Pulmonary Functions Testing Results:     No data to display              Assessment & Plan:   # Episodic dyspnea Unclear what extent this could be driven by borderline hyperthyroid. Sensitivity to strong fragrances, poor air quality could alternatively be manifestation of small airways disease.   Plan: - has follow up in August with endocrinology - we will check PFTs (breathing tests) before and after xopenex inhaler to see if there's any evidence of asthma or alternative small airways disease in 3 months. If you're feeling 100% better before this appointment then feel free to cancel breathing tests and/or appointment  - see you in 3 months or sooner if need be!     Maryjane Hurter, MD [facility] 12/12/2021 4:16 PM

## 2021-12-13 ENCOUNTER — Ambulatory Visit: Payer: PRIVATE HEALTH INSURANCE | Admitting: Student

## 2022-01-16 NOTE — Progress Notes (Unsigned)
Synopsis: Referred for dyspnea by Georgina Quint, *  Subjective:   PATIENT ID: Alexander Adams GENDER: male DOB: 1990/10/17, MRN: [id number]  No chief complaint on file.  31yM with GERD, anxiety, covid-19 infection 01/2019 symptomatically severe but not hospitalized referred for dyspnea on and off for the last couple of months. Has trouble getting full deep breath sometimes. Notes sensation of chest congestion. Sensitive to strong fragrances and worries about effect of poor air quality on his breathing. He has no accompanying throat tightness with episodes.   Took epinephrine inhaler OTC on his way to comedy show and then had racing heart rate and palpitations. Then went to ED. Hasn't had course of steroids before for this. No cough.   Has never seen pulmonologist before.   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 Exercises frequently. 1 year of cigarettes when 15, did smoke MJ a few hits - typically joints/bowl or sometimes wax with vape, nicotine vape for 6 months last year and has quit all of that. He has no pets  Interval HPI Referral placed to orthodontist for mild OSA since last visit  PFTs  Otherwise pertinent review of systems is negative.  Past Medical History:  Diagnosis Date   GERD (gastroesophageal reflux disease)    Reflux      Family History  Problem Relation Age of Onset   Diabetes Father    Hypertension Father    Heart failure Father      No past surgical history on file.  Social History   Socioeconomic History   Marital status: Single    Spouse name: Not on file   Number of children: Not on file   Years of education: Not on file   Highest education level: Not on file  Occupational History   Not on file  Tobacco Use   Smoking status: Former    Packs/day: 0.25    Years: 1.00    Total pack years: 0.25    Types: Cigarettes    Quit date: 02/27/2005    Years since quitting: 16.8   Smokeless tobacco: Never  Vaping Use    Vaping Use: Former   Start date: 07/28/2020   Quit date: 02/27/2021  Substance and Sexual Activity   Alcohol use: No   Drug use: Not Currently    Types: Marijuana    Comment: Using CBD currently   Sexual activity: Not on file  Other Topics Concern   Not on file  Social History Narrative   Not on file   Social Determinants of Health   Financial Resource Strain: Not on file  Food Insecurity: Not on file  Transportation Needs: Not on file  Physical Activity: Not on file  Stress: Not on file  Social Connections: Not on file  Intimate Partner Violence: Not on file     No Known Allergies   Outpatient Medications Prior to Visit  Medication Sig Dispense Refill   ALPRAZolam (XANAX) 0.5 MG tablet Take before flying as needed. (Patient not taking: Reported on 12/01/2021) 20 tablet 0   hydrOXYzine (ATARAX/VISTARIL) 25 MG tablet Take 1 tablet (25 mg total) by mouth every 6 (six) hours as needed for anxiety. (Patient not taking: Reported on 12/01/2021) 20 tablet 0   MAGNESIUM OXIDE 400 PO Take 1 tablet by mouth daily. (Patient not taking: Reported on 12/01/2021)     No facility-administered medications prior to visit.       Objective:   Physical Exam:  General appearance: 31 y.o., male, NAD, conversant  Eyes: anicteric sclerae; PERRL, tracking appropriately HENT: NCAT; MMM Neck: Trachea midline; no lymphadenopathy, no JVD Lungs: CTAB, no crackles, no wheeze, with normal respiratory effort CV: RRR, no murmur  Abdomen: Soft, non-tender; non-distended, BS present  Extremities: No peripheral edema, warm Skin: Normal turgor and texture; no rash Psych: Appropriate affect Neuro: Alert and oriented to person and place, no focal deficit     There were no vitals filed for this visit.    on RA BMI Readings from Last 3 Encounters:  12/01/21 28.68 kg/m  10/26/21 28.65 kg/m  09/16/21 27.52 kg/m   Wt Readings from Last 3 Encounters:  12/01/21 188 lb 9.6 oz (85.5 kg)  10/26/21 188 lb  6.4 oz (85.5 kg)  09/16/21 181 lb (82.1 kg)     CBC    Component Value Date/Time   WBC 11.0 (H) 08/05/2021 1739   RBC 5.06 08/05/2021 1739   HGB 14.4 08/05/2021 1739   HCT 41.9 08/05/2021 1739   PLT 294 08/05/2021 1739   MCV 82.8 08/05/2021 1739   MCH 28.5 08/05/2021 1739   MCHC 34.4 08/05/2021 1739   RDW 13.0 08/05/2021 1739    Tsh ~0.5, fT4 1.84  Chest Imaging: CXR 08/05/21 reviewed by me unremarkable  Pulmonary Functions Testing Results:     No data to display              Assessment & Plan:   # Episodic dyspnea Unclear what extent this could be driven by borderline hyperthyroid. Sensitivity to strong fragrances, poor air quality could alternatively be manifestation of small airways disease.   Plan: - has follow up in August with endocrinology - we will check PFTs (breathing tests) before and after xopenex inhaler to see if there's any evidence of asthma or alternative small airways disease in 3 months. If you're feeling 100% better before this appointment then feel free to cancel breathing tests and/or appointment  - see you in 3 months or sooner if need be!     Omar Person, MD [facility] 01/16/2022 6:15 PM

## 2022-01-18 ENCOUNTER — Ambulatory Visit (INDEPENDENT_AMBULATORY_CARE_PROVIDER_SITE_OTHER): Payer: Commercial Managed Care - HMO | Admitting: Student

## 2022-01-18 ENCOUNTER — Encounter: Payer: Self-pay | Admitting: Student

## 2022-01-18 VITALS — BP 126/70 | HR 57 | Temp 97.9°F | Ht 68.0 in | Wt 194.0 lb

## 2022-01-18 DIAGNOSIS — G4733 Obstructive sleep apnea (adult) (pediatric): Secondary | ICD-10-CM

## 2022-01-18 DIAGNOSIS — R0600 Dyspnea, unspecified: Secondary | ICD-10-CM

## 2022-01-18 LAB — PULMONARY FUNCTION TEST
DL/VA % pred: 111 %
DL/VA: 5.46 ml/min/mmHg/L
DLCO cor % pred: 110 %
DLCO cor: 33.55 ml/min/mmHg
DLCO unc % pred: 110 %
DLCO unc: 33.55 ml/min/mmHg
FEF 25-75 Post: 4.7 L/s
FEF 25-75 Pre: 3.92 L/s
FEF2575-%Change-Post: 19 %
FEF2575-%Pred-Post: 112 %
FEF2575-%Pred-Pre: 93 %
FEV1-%Change-Post: 7 %
FEV1-%Pred-Post: 103 %
FEV1-%Pred-Pre: 96 %
FEV1-Post: 4.33 L
FEV1-Pre: 4.03 L
FEV1FVC-%Change-Post: 4 %
FEV1FVC-%Pred-Pre: 99 %
FEV6-%Change-Post: 4 %
FEV6-%Pred-Post: 101 %
FEV6-%Pred-Pre: 97 %
FEV6-Post: 5.14 L
FEV6-Pre: 4.93 L
FEV6FVC-%Pred-Post: 101 %
FEV6FVC-%Pred-Pre: 101 %
FVC-%Change-Post: 3 %
FVC-%Pred-Post: 100 %
FVC-%Pred-Pre: 97 %
FVC-Post: 5.14 L
FVC-Pre: 4.98 L
Post FEV1/FVC ratio: 84 %
Post FEV6/FVC ratio: 100 %
Pre FEV1/FVC ratio: 81 %
Pre FEV6/FVC Ratio: 100 %
RV % pred: 125 %
RV: 1.95 L
TLC % pred: 106 %
TLC: 6.9 L

## 2022-01-18 NOTE — Progress Notes (Signed)
Full PFT performed today. °

## 2022-01-18 NOTE — Patient Instructions (Addendum)
- see lifestyle measures below for GERD for your sensation of mucus in the throat - come back and see [place] if trouble breathing, cough lasting beyond 8 weeks - we can always consider more sensitive testing for asthma  Gastroesophageal Reflux Disease, Adult Gastroesophageal reflux (GER) happens when acid from the stomach flows up into the tube that connects the mouth and the stomach (esophagus). Normally, food travels down the esophagus and stays in the stomach to be digested. However, when a person has GER, food and stomach acid sometimes move back up into the esophagus. If this becomes a more serious problem, the person may be diagnosed with a disease called gastroesophageal reflux disease (GERD). GERD occurs when the reflux: Happens often. Causes frequent or severe symptoms. Causes problems such as damage to the esophagus. When stomach acid comes in contact with the esophagus, the acid may cause inflammation in the esophagus. Over time, GERD may create small holes (ulcers) in the lining of the esophagus. What are the causes? This condition is caused by a problem with the muscle between the esophagus and the stomach (lower esophageal sphincter, or LES). Normally, the LES muscle closes after food passes through the esophagus to the stomach. When the LES is weakened or abnormal, it does not close properly, and that allows food and stomach acid to go back up into the esophagus. The LES can be weakened by certain dietary substances, medicines, and medical conditions, including: Tobacco use. Pregnancy. Having a hiatal hernia. Alcohol use. Certain foods and beverages, such as coffee, chocolate, onions, and peppermint. What increases the risk? You are more likely to develop this condition if you: Have an increased body weight. Have a connective tissue disorder. Take NSAIDs, such as ibuprofen. What are the signs or symptoms? Symptoms of this condition include: Heartburn. Difficult or painful  swallowing and the feeling of having a lump in the throat. A bitter taste in the mouth. Bad breath and having a large amount of saliva. Having an upset or bloated stomach and belching. Chest pain. Different conditions can cause chest pain. Make sure you see your health care provider if you experience chest pain. Shortness of breath or wheezing. Ongoing (chronic) cough or a nighttime cough. Wearing away of tooth enamel. Weight loss. How is this diagnosed? This condition may be diagnosed based on a medical history and a physical exam. To determine if you have mild or severe GERD, your health care provider may also monitor how you respond to treatment. You may also have tests, including: A test to examine your stomach and esophagus with a small camera (endoscopy). A test that measures the acidity level in your esophagus. A test that measures how much pressure is on your esophagus. A barium swallow or modified barium swallow test to show the shape, size, and functioning of your esophagus. How is this treated? Treatment for this condition may vary depending on how severe your symptoms are. Your health care provider may recommend: Changes to your diet. Medicine. Surgery. The goal of treatment is to help relieve your symptoms and to prevent complications. Follow these instructions at home: Eating and drinking  Follow a diet as recommended by your health care provider. This may involve avoiding foods and drinks such as: Coffee and tea, with or without caffeine. Drinks that contain alcohol. Energy drinks and sports drinks. Carbonated drinks or sodas. Chocolate and cocoa. Peppermint and mint flavorings. Garlic and onions. Horseradish. Spicy and acidic foods, including peppers, chili powder, curry powder, vinegar, hot sauces, and barbecue  sauce. Citrus fruit juices and citrus fruits, such as oranges, lemons, and limes. Tomato-based foods, such as red sauce, chili, salsa, and pizza with red  sauce. Fried and fatty foods, such as donuts, french fries, potato chips, and high-fat dressings. High-fat meats, such as hot dogs and fatty cuts of red and white meats, such as rib eye steak, sausage, ham, and bacon. High-fat dairy items, such as whole milk, butter, and cream cheese. Eat small, frequent meals instead of large meals. Avoid drinking large amounts of liquid with your meals. Avoid eating meals during the 2-3 hours before bedtime. Avoid lying down right after you eat. Do not exercise right after you eat. Lifestyle  Do not use any products that contain nicotine or tobacco. These products include cigarettes, chewing tobacco, and vaping devices, such as e-cigarettes. If you need help quitting, ask your health care provider. Try to reduce your stress by using methods such as yoga or meditation. If you need help reducing stress, ask your health care provider. If you are overweight, reduce your weight to an amount that is healthy for you. Ask your health care provider for guidance about a safe weight loss goal. General instructions Pay attention to any changes in your symptoms. Take over-the-counter and prescription medicines only as told by your health care provider. Do not take aspirin, ibuprofen, or other NSAIDs unless your health care provider told you to take these medicines. Wear loose-fitting clothing. Do not wear anything tight around your waist that causes pressure on your abdomen. Raise (elevate) the head of your bed about 6 inches (15 cm). You can use a wedge to do this. Avoid bending over if this makes your symptoms worse. Keep all follow-up visits. This is important. Contact a health care provider if: You have: New symptoms. Unexplained weight loss. Difficulty swallowing or it hurts to swallow. Wheezing or a persistent cough. A hoarse voice. Your symptoms do not improve with treatment. Get help right away if: You have sudden pain in your arms, neck, jaw, teeth, or  back. You suddenly feel sweaty, dizzy, or light-headed. You have chest pain or shortness of breath. You vomit and the vomit is green, yellow, or black, or it looks like blood or coffee grounds. You faint. You have stool that is red, bloody, or black. You cannot swallow, drink, or eat. These symptoms may represent a serious problem that is an emergency. Do not wait to see if the symptoms will go away. Get medical help right away. Call your local emergency services (911 in the [place] Do not drive yourself to the hospital. Summary Gastroesophageal reflux happens when acid from the stomach flows up into the esophagus. GERD is a disease in which the reflux happens often, causes frequent or severe symptoms, or causes problems such as damage to the esophagus. Treatment for this condition may vary depending on how severe your symptoms are. Your health care provider may recommend diet and lifestyle changes, medicine, or surgery. Contact a health care provider if you have new or worsening symptoms. Take over-the-counter and prescription medicines only as told by your health care provider. Do not take aspirin, ibuprofen, or other NSAIDs unless your health care provider told you to do so. Keep all follow-up visits as told by your health care provider. This is important. This information is not intended to replace advice given to you by your health care provider. Make sure you discuss any questions you have with your health care provider. Document Revised: 08/25/2019 Document Reviewed: 08/25/2019 [employer]vier Patient  Education  2023 [employer]  

## 2022-01-18 NOTE — Patient Instructions (Signed)
Full PFT performed today. °

## 2022-02-15 ENCOUNTER — Encounter: Payer: Self-pay | Admitting: Emergency Medicine

## 2022-02-15 NOTE — Telephone Encounter (Signed)
Contact dentist office or go to urgent care center

## 2023-07-08 IMAGING — CR DG CHEST 2V
2 series · 2 of 2 positions shown · non-contrast
Comparison: None Available.

CLINICAL DATA: Chest pain shortness of breath

EXAM:
CHEST - 2 VIEW

[chest pa]
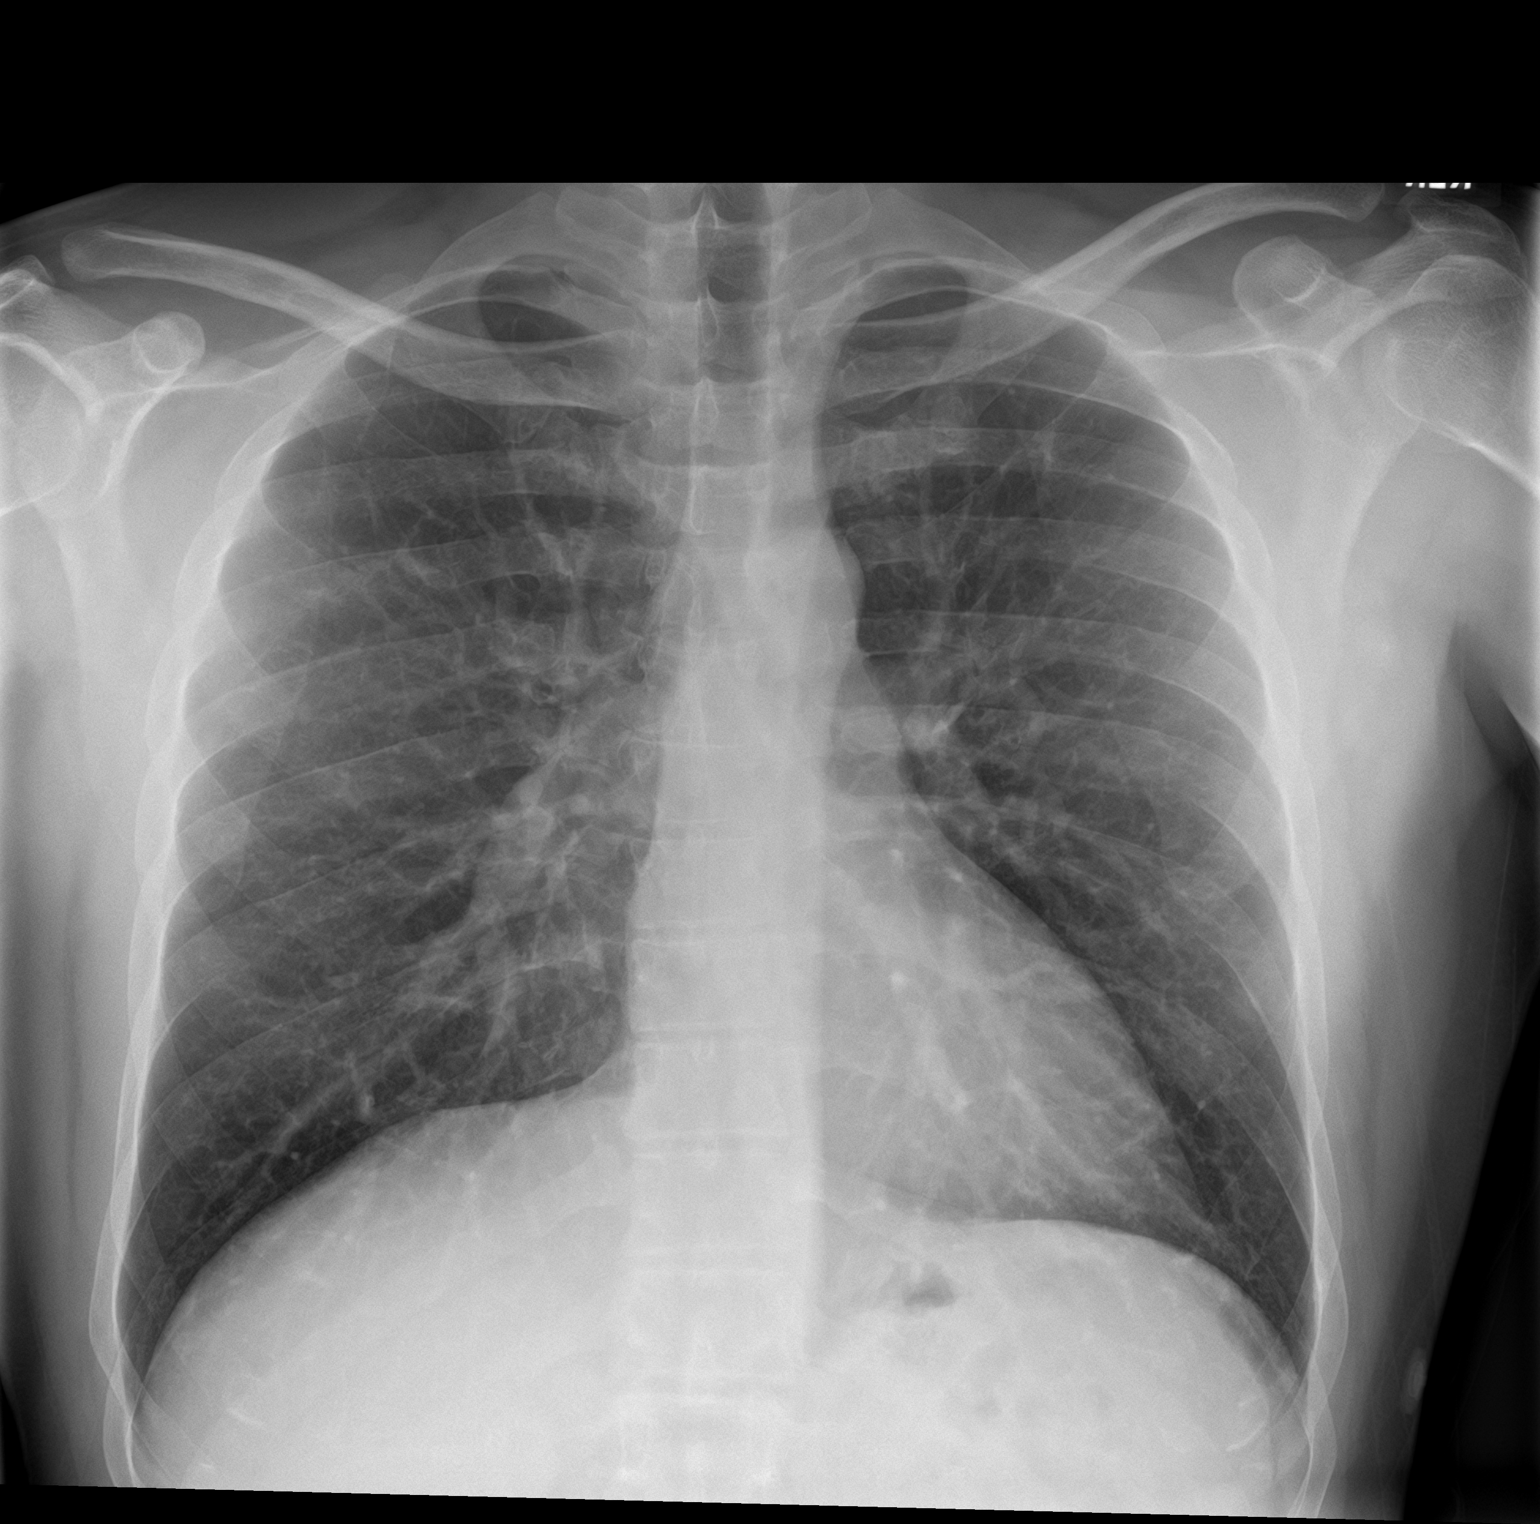

[chest lat]
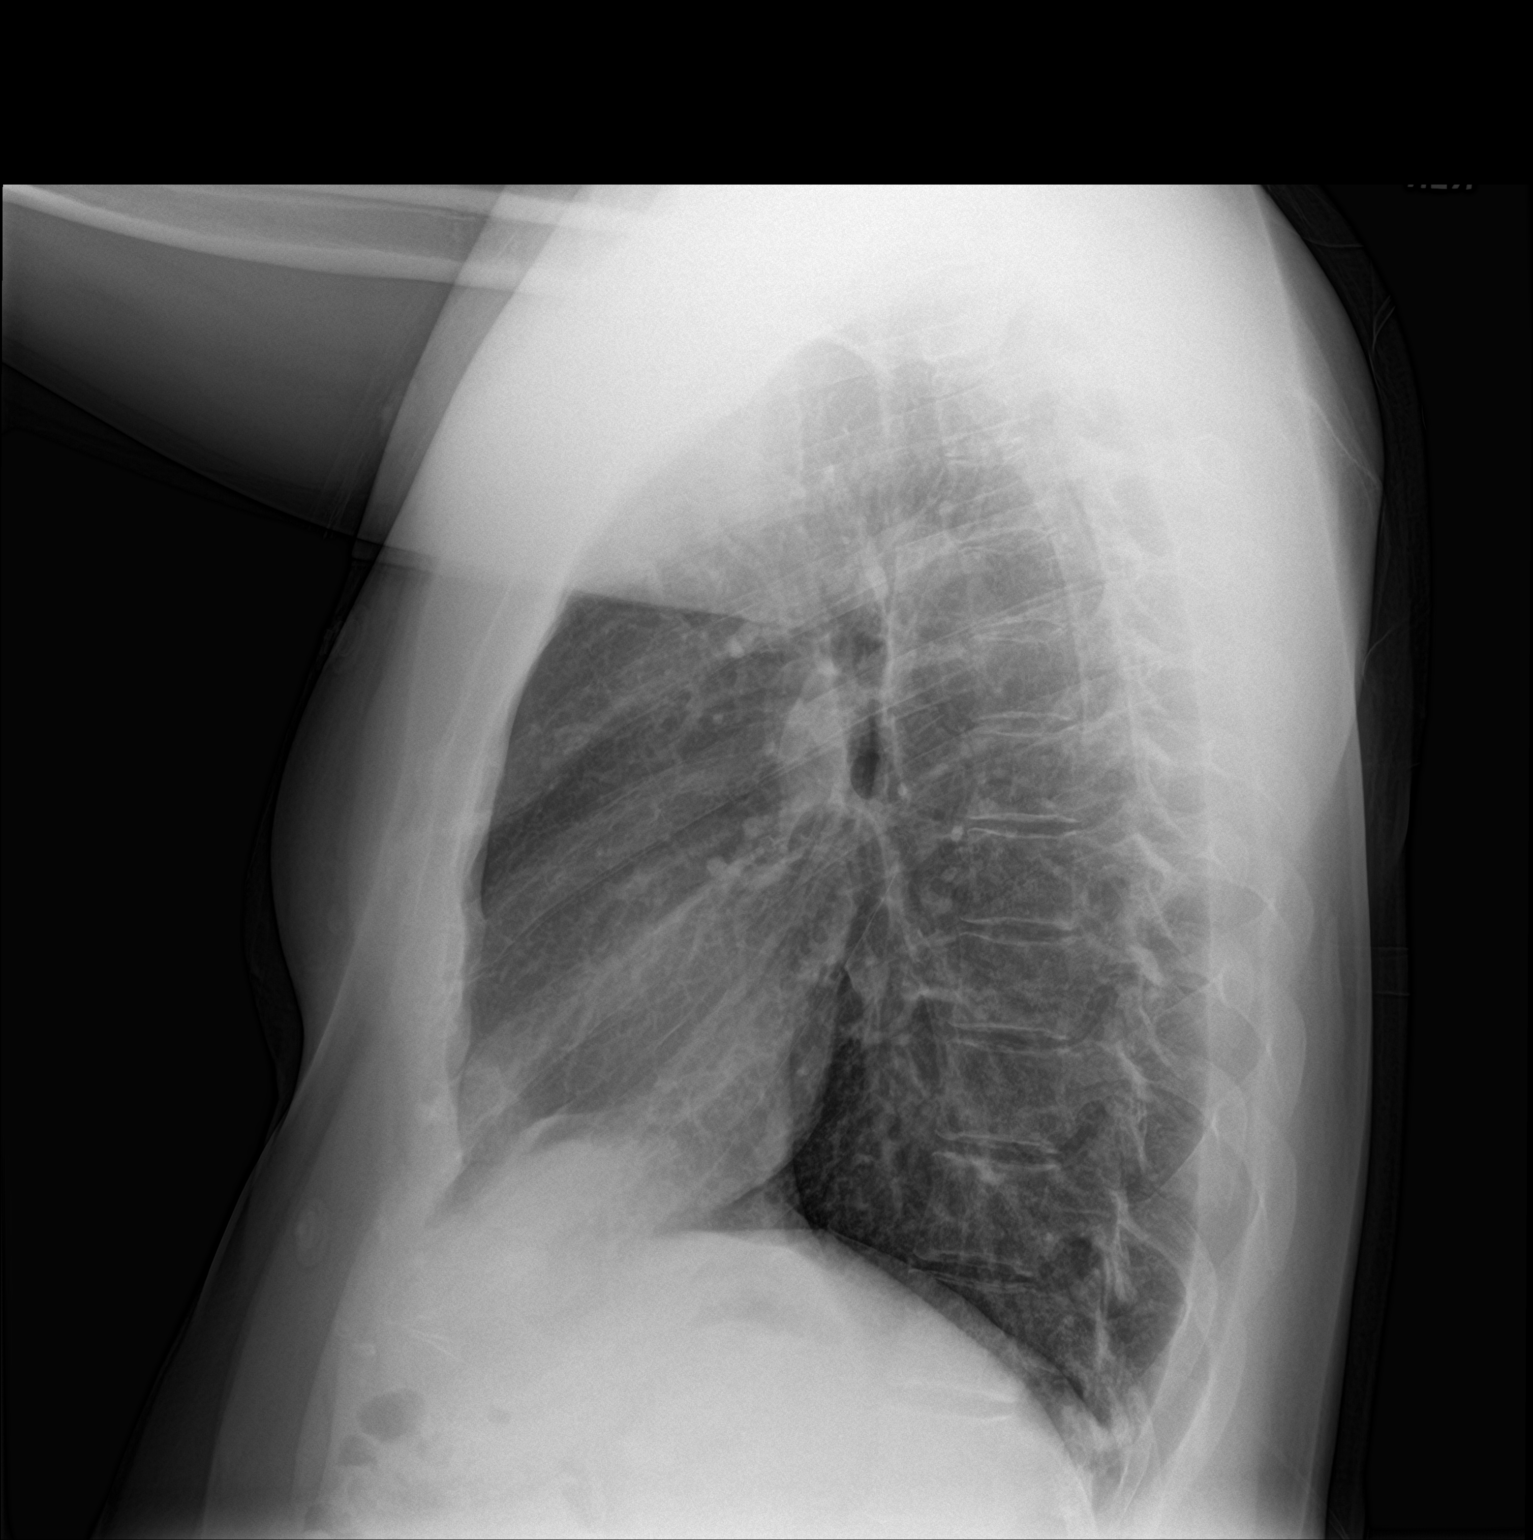

[2 of 2 positions shown; findings below may reference images not displayed]

FINDINGS: Cardiac and mediastinal contours are within normal limits. No focal
pulmonary opacity. No pleural effusion or pneumothorax. No acute
osseous abnormality.
IMPRESSION: No acute cardiopulmonary process.
